# Patient Record
Sex: Male | Born: 1940 | Race: White | Hispanic: No | Marital: Married | Smoking: Never smoker
Health system: Southern US, Community
[De-identification: ages and names within clinical notes are randomized; demographics above are authoritative.]

## PROBLEM LIST (undated history)

## (undated) DIAGNOSIS — I10 Essential (primary) hypertension: Secondary | ICD-10-CM

## (undated) DIAGNOSIS — E039 Hypothyroidism, unspecified: Secondary | ICD-10-CM

## (undated) HISTORY — PX: SIGMOIDECTOMY: SHX176

---

## 2005-02-24 ENCOUNTER — Ambulatory Visit: Payer: Self-pay | Admitting: Internal Medicine

## 2005-03-09 ENCOUNTER — Emergency Department: Payer: Self-pay | Admitting: Emergency Medicine

## 2005-03-11 ENCOUNTER — Other Ambulatory Visit: Payer: Self-pay

## 2005-03-12 ENCOUNTER — Inpatient Hospital Stay: Payer: Self-pay | Admitting: General Surgery

## 2006-03-07 ENCOUNTER — Ambulatory Visit: Payer: Self-pay | Admitting: Internal Medicine

## 2006-04-03 ENCOUNTER — Ambulatory Visit: Payer: Self-pay | Admitting: Internal Medicine

## 2007-10-24 ENCOUNTER — Ambulatory Visit: Payer: Self-pay | Admitting: Unknown Physician Specialty

## 2008-01-16 ENCOUNTER — Ambulatory Visit: Payer: Self-pay | Admitting: Internal Medicine

## 2008-02-14 ENCOUNTER — Inpatient Hospital Stay (HOSPITAL_COMMUNITY): Admission: RE | Admit: 2008-02-14 | Discharge: 2008-02-15 | Payer: Self-pay | Admitting: Neurosurgery

## 2010-09-07 NOTE — Op Note (Signed)
NAME:  [name], [name]                  ACCOUNT NO.:  [id number]   MEDICAL RECORD NO.:  [id number]          PATIENT TYPE:  OIB   LOCATION:  3535                         FACILITY:  [facility]   PHYSICIAN:  Hewitt Shorts, M.D.DATE OF BIRTH:  1940/07/24   DATE OF PROCEDURE:  02/14/2008  DATE OF DISCHARGE:                               OPERATIVE REPORT   PREOPERATIVE DIAGNOSES:  1. 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  2. Cervical spondylosis.  3. Cervical degenerative disk disease.  4. Cervical radiculopathy.   POSTOPERATIVE DIAGNOSES:  1. 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  2. Cervical spondylosis.  3. Cervical degenerative disk disease.  4. Cervical radiculopathy.   PROCEDURE:  C5-6 anterior cervical decompression arthrodesis with  allograft and tethered cervical plating.   SURGEON:  Hewitt Shorts, MD.   ASSISTANT:  Nelia Shi. Webb Silversmith, NP   ANESTHESIA:  General endotracheal.   INDICATIONS:  The patient is a 70 year old man who presented with a left  cervical radiculopathy.  He was found to have a left C5-6 cervical disk  herniation with spondylosis disk herniation and degenerative disk  disease.  A decision was made to proceed with single-level anterior  cervical decompression and arthrodesis.  He had a previous C6-7 ACDF 14  years ago.   DESCRIPTION OF PROCEDURE:  The patient was brought to the operating room  and placed under general endotracheal anesthesia.  The patient was  placed in 10 pounds of Holter traction.  The neck was prepped with  Betadine soaked solution and draped in a sterile fashion.  A horizontal  incision was made in the left side of the neck.  The line of the  incision was infiltrated with local anesthetic with epinephrine.  Incision was made and carried down through the subcutaneous tissue.  Bipolar cautery and electrocauteries to maintain hemostasis.  Dissection  was carried down through the ventral aspect of the vertebral column  through the avascular plane  leaving the sternocleidomastoid, carotid  artery, and jugular vein laterally and trachea and esophagus medially.  The x-ray was taken and the C5-6 vertebral disk space was identified and  diskectomies begun with incision of the annulus continued with micro  curettes and pituitary rongeurs.  There was significant ventral spurring  that was removed using osteophytic removal and Kerrison punches.  The  microscope was draped and brought into the field to provide additional  navigation, illumination, and visualization.  The remainder of the  decompression performed using microdissection and microsurgical  technique.  The degenerative disk material was removed using micro  curettes and pituitary rongeurs.  The cauterized endplates were removed  using micro curettes and X-Max drill and then posterior osteophytic  overgrowth was removed using the X-Max drill and a 2-mm Kerrison punch  with a thin foot plate.  The posterior large ligament was carefully  removed and as we dissected laterally to the left.  A moderately large  spur like disk herniation was encountered.  Several fragments were  removed, and we were able to decompress left side of the thecal sac and  the exiting left C6 nerve root.  We  also decompressed the right C6 nerve  root as well and in the end good decompression was achieved in the  thecal sac and nerve roots bilaterally.  The wounds irrigated with  Bacitracin solution.  Checked for hemostasis.  Gelfoam with thrombin was  used to help to establish the hemostasis.  The Gelfoam was removed and  hemostasis was confirmed.  Then we measured the height of the  intervertebral disk space and selected an 8-mm allograft implant.  It  was hydrated in saline solutions and positioned in the intervertebral  disk space and countersunk.  We then discontinued the cervical traction  and selected a 14-mm tethered cervical plate, it was positioned over the  fusion construct and secured to the  vertebra with 4 x 14 mm of variable-  angle screws.  Each screw was drilled and the screws placed in the  alternating fashion.  Once all 4 screws were placed final tightening was  performed.  Then was irrigated with Bacitracin solution.  Check for  hemostasis was established and confirmed and then we proceeded with  closure.  X-ray showed the graft, plate, and screws were all in good  position.  The alignment to be good.  Platysma was closed with  interrupted inverted 2-0 undyed Vicryl sutures.  The subcutaneous and  subcuticular were closed with interrupted inverted 3-0 undyed Vicryl  sutures.  The skin was re-approximated with Dermabond.  The procedure  was tolerated well.  The estimated blood loss was 10 mL.  Sponge and  needle count were correct.  Following surgery, the patient was placed in  a soft cervical collar to be reversed from the anesthetic, extubated,  and transferred to the recovery room for further care.      Hewitt Shorts, M.D.  Electronically Signed     RWN/MEDQ  D:  02/14/2008  T:  02/15/2008  Job:  [id number]

## 2010-11-19 ENCOUNTER — Encounter: Payer: Self-pay | Admitting: Urology

## 2010-11-24 ENCOUNTER — Encounter: Payer: Self-pay | Admitting: Urology

## 2010-12-25 ENCOUNTER — Encounter: Payer: Self-pay | Admitting: Urology

## 2011-01-25 LAB — CBC
HCT: 45.9
Hemoglobin: 15.6
MCHC: 33.9
MCV: 93.9
Platelets: 195
RBC: 4.89
RDW: 13.2
WBC: 11.7 — ABNORMAL HIGH

## 2011-01-25 LAB — BASIC METABOLIC PANEL
BUN: 14
CO2: 30
Calcium: 9.7
Chloride: 102
Creatinine, Ser: 1.12
GFR calc Af Amer: >60
GFR calc non Af Amer: >60
Glucose, Bld: 107 — ABNORMAL HIGH
Potassium: 4.9
Sodium: 141

## 2011-05-10 ENCOUNTER — Ambulatory Visit: Payer: Self-pay | Admitting: Urology

## 2011-10-25 ENCOUNTER — Encounter: Payer: Self-pay | Admitting: Urology

## 2011-10-25 LAB — CBC WITH DIFFERENTIAL/PLATELET
Basophil #: 0 10*3/uL (ref 0.0–0.1)
Eosinophil #: 0.1 10*3/uL (ref 0.0–0.7)
HCT: 50.9 % (ref 40.0–52.0)
Lymphocyte #: 2.4 10*3/uL (ref 1.0–3.6)
Lymphocyte %: 31.8 %
Monocyte #: 0.3 x10 3/mm (ref 0.2–1.0)
Monocyte %: 4.4 %
RDW: 14.6 % — ABNORMAL HIGH (ref 11.5–14.5)

## 2011-11-02 LAB — HEMATOCRIT: HCT: 47.6 % (ref 40.0–52.0)

## 2011-11-02 LAB — HEMOGLOBIN: HGB: 15.8 g/dL (ref 13.0–18.0)

## 2012-03-16 ENCOUNTER — Encounter: Payer: Self-pay | Admitting: Urology

## 2012-03-16 LAB — HEMOGLOBIN: HGB: 18.2 g/dL — ABNORMAL HIGH (ref 13.0–18.0)

## 2012-03-27 ENCOUNTER — Encounter: Payer: Self-pay | Admitting: Cardiovascular Disease

## 2012-03-27 LAB — CBC WITH DIFFERENTIAL/PLATELET
Basophil %: 0.7 %
Eosinophil %: 1.8 %
HGB: 16.4 g/dL (ref 13.0–18.0)
Lymphocyte #: 2.8 10*3/uL (ref 1.0–3.6)
MCH: 32.6 pg (ref 26.0–34.0)
MCV: 94 fL (ref 80–100)
Monocyte %: 6.2 %
Platelet: 192 10*3/uL (ref 150–440)
RBC: 5.04 10*6/uL (ref 4.40–5.90)
WBC: 8.7 10*3/uL (ref 3.8–10.6)

## 2012-04-25 ENCOUNTER — Encounter: Payer: Self-pay | Admitting: Cardiovascular Disease

## 2012-08-22 ENCOUNTER — Encounter: Payer: Self-pay | Admitting: Family Medicine

## 2012-08-22 LAB — CBC WITH DIFFERENTIAL/PLATELET
Eosinophil #: 0.1 10*3/uL (ref 0.0–0.7)
Lymphocyte %: 32 %
MCH: 32.4 pg (ref 26.0–34.0)
Monocyte #: 0.5 x10 3/mm (ref 0.2–1.0)
Monocyte %: 6.5 %
Neutrophil %: 59.5 %
Platelet: 230 10*3/uL (ref 150–440)
WBC: 7.8 10*3/uL (ref 3.8–10.6)

## 2012-08-23 ENCOUNTER — Encounter: Payer: Self-pay | Admitting: Family Medicine

## 2012-09-23 ENCOUNTER — Encounter: Payer: Self-pay | Admitting: Family Medicine

## 2013-04-19 ENCOUNTER — Emergency Department: Payer: Self-pay | Admitting: Emergency Medicine

## 2013-04-19 LAB — COMPREHENSIVE METABOLIC PANEL
Alkaline Phosphatase: 52 U/L
Anion Gap: 7 (ref 7–16)
BUN: 26 mg/dL — ABNORMAL HIGH (ref 7–18)
Bilirubin,Total: 0.4 mg/dL (ref 0.2–1.0)
Chloride: 108 mmol/L — ABNORMAL HIGH (ref 98–107)
Co2: 25 mmol/L (ref 21–32)
Creatinine: 1.43 mg/dL — ABNORMAL HIGH (ref 0.60–1.30)
EGFR (African American): 56 — ABNORMAL LOW
EGFR (Non-African Amer.): 48 — ABNORMAL LOW
SGPT (ALT): 32 U/L (ref 12–78)

## 2013-04-19 LAB — CBC WITH DIFFERENTIAL/PLATELET
Basophil #: 0.1 10*3/uL (ref 0.0–0.1)
HCT: 47.9 % (ref 40.0–52.0)
Lymphocyte #: 2.9 10*3/uL (ref 1.0–3.6)
Lymphocyte %: 32.6 %
MCHC: 33.9 g/dL (ref 32.0–36.0)
MCV: 95 fL (ref 80–100)
Monocyte #: 0.7 x10 3/mm (ref 0.2–1.0)
RBC: 5.02 10*6/uL (ref 4.40–5.90)
RDW: 13.2 % (ref 11.5–14.5)

## 2013-04-19 LAB — URINALYSIS, COMPLETE
Bilirubin,UR: NEGATIVE
Blood: NEGATIVE
Glucose,UR: NEGATIVE mg/dL (ref 0–75)
Leukocyte Esterase: NEGATIVE
Ph: 5 (ref 4.5–8.0)

## 2013-04-19 LAB — CBC
Platelet: 260 10*3/uL (ref 150–440)
RBC: 5.09 10*6/uL (ref 4.40–5.90)

## 2013-05-22 ENCOUNTER — Ambulatory Visit: Payer: Self-pay | Admitting: Unknown Physician Specialty

## 2013-05-25 LAB — PATHOLOGY REPORT

## 2013-09-12 ENCOUNTER — Inpatient Hospital Stay: Payer: Self-pay | Admitting: Internal Medicine

## 2013-09-12 LAB — BASIC METABOLIC PANEL
Anion Gap: 9 (ref 7–16)
BUN: 28 mg/dL — ABNORMAL HIGH (ref 7–18)
CHLORIDE: 102 mmol/L (ref 98–107)
Calcium, Total: 9.4 mg/dL (ref 8.5–10.1)
Co2: 28 mmol/L (ref 21–32)
Creatinine: 1.6 mg/dL — ABNORMAL HIGH (ref 0.60–1.30)
GFR CALC AF AMER: 49 — AB
GFR CALC NON AF AMER: 42 — AB
Glucose: 124 mg/dL — ABNORMAL HIGH (ref 65–99)
Osmolality: 284 (ref 275–301)
Potassium: 4 mmol/L (ref 3.5–5.1)
Sodium: 139 mmol/L (ref 136–145)

## 2013-09-12 LAB — CBC
HCT: 52.7 % — ABNORMAL HIGH (ref 40.0–52.0)
HGB: 17 g/dL (ref 13.0–18.0)
MCH: 30.6 pg (ref 26.0–34.0)
MCHC: 32.3 g/dL (ref 32.0–36.0)
MCV: 95 fL (ref 80–100)
Platelet: 208 10*3/uL (ref 150–440)
RBC: 5.57 10*6/uL (ref 4.40–5.90)
RDW: 14.8 % — AB (ref 11.5–14.5)
WBC: 15.4 10*3/uL — ABNORMAL HIGH (ref 3.8–10.6)

## 2013-09-12 LAB — TROPONIN I: Troponin-I: <0.02 ng/mL

## 2013-09-12 LAB — HEPATIC FUNCTION PANEL A (ARMC)
ALBUMIN: 4.3 g/dL (ref 3.4–5.0)
ALK PHOS: 55 U/L
ALT: 23 U/L (ref 12–78)
AST: 20 U/L (ref 15–37)
BILIRUBIN DIRECT: 0.1 mg/dL (ref 0.00–0.20)
BILIRUBIN TOTAL: 0.7 mg/dL (ref 0.2–1.0)
TOTAL PROTEIN: 8.5 g/dL — AB (ref 6.4–8.2)

## 2013-09-12 LAB — LIPASE, BLOOD: LIPASE: 2357 U/L — AB (ref 73–393)

## 2013-09-13 LAB — COMPREHENSIVE METABOLIC PANEL
ALBUMIN: 3.6 g/dL (ref 3.4–5.0)
ALK PHOS: 42 U/L — AB
ALT: 16 U/L (ref 12–78)
Anion Gap: 8 (ref 7–16)
BILIRUBIN TOTAL: 0.9 mg/dL (ref 0.2–1.0)
BUN: 19 mg/dL — ABNORMAL HIGH (ref 7–18)
Calcium, Total: 8.3 mg/dL — ABNORMAL LOW (ref 8.5–10.1)
Chloride: 105 mmol/L (ref 98–107)
Co2: 24 mmol/L (ref 21–32)
Creatinine: 1.38 mg/dL — ABNORMAL HIGH (ref 0.60–1.30)
EGFR (African American): 58 — ABNORMAL LOW
EGFR (Non-African Amer.): 50 — ABNORMAL LOW
GLUCOSE: 95 mg/dL (ref 65–99)
OSMOLALITY: 276 (ref 275–301)
Potassium: 3.6 mmol/L (ref 3.5–5.1)
SGOT(AST): 24 U/L (ref 15–37)
SODIUM: 137 mmol/L (ref 136–145)
TOTAL PROTEIN: 7.4 g/dL (ref 6.4–8.2)

## 2013-09-13 LAB — TRIGLYCERIDES: Triglycerides: 170 mg/dL (ref 0–200)

## 2013-09-13 LAB — LIPASE, BLOOD: Lipase: 3679 U/L — ABNORMAL HIGH (ref 73–393)

## 2013-09-14 LAB — COMPREHENSIVE METABOLIC PANEL
ALBUMIN: 3.1 g/dL — AB (ref 3.4–5.0)
ANION GAP: 7 (ref 7–16)
Alkaline Phosphatase: 40 U/L — ABNORMAL LOW
BUN: 16 mg/dL (ref 7–18)
Bilirubin,Total: 0.9 mg/dL (ref 0.2–1.0)
CHLORIDE: 107 mmol/L (ref 98–107)
CO2: 24 mmol/L (ref 21–32)
Calcium, Total: 8 mg/dL — ABNORMAL LOW (ref 8.5–10.1)
Creatinine: 1.45 mg/dL — ABNORMAL HIGH (ref 0.60–1.30)
EGFR (African American): 55 — ABNORMAL LOW
GFR CALC NON AF AMER: 47 — AB
Glucose: 96 mg/dL (ref 65–99)
OSMOLALITY: 277 (ref 275–301)
Potassium: 3.6 mmol/L (ref 3.5–5.1)
SGOT(AST): 20 U/L (ref 15–37)
SGPT (ALT): 11 U/L — ABNORMAL LOW (ref 12–78)
Sodium: 138 mmol/L (ref 136–145)
Total Protein: 7 g/dL (ref 6.4–8.2)

## 2013-09-14 LAB — CBC WITH DIFFERENTIAL/PLATELET
BASOS PCT: 0.4 %
Basophil #: 0.1 10*3/uL (ref 0.0–0.1)
EOS ABS: 0 10*3/uL (ref 0.0–0.7)
Eosinophil %: 0.1 %
HCT: 43.9 % (ref 40.0–52.0)
HGB: 14.8 g/dL (ref 13.0–18.0)
LYMPHS ABS: 1.3 10*3/uL (ref 1.0–3.6)
LYMPHS PCT: 10.1 %
MCH: 32.1 pg (ref 26.0–34.0)
MCHC: 33.7 g/dL (ref 32.0–36.0)
MCV: 95 fL (ref 80–100)
MONO ABS: 0.9 x10 3/mm (ref 0.2–1.0)
MONOS PCT: 7.1 %
NEUTROS PCT: 82.3 %
Neutrophil #: 10.8 10*3/uL — ABNORMAL HIGH (ref 1.4–6.5)
Platelet: 147 10*3/uL — ABNORMAL LOW (ref 150–440)
RBC: 4.61 10*6/uL (ref 4.40–5.90)
RDW: 14.9 % — AB (ref 11.5–14.5)
WBC: 13.1 10*3/uL — ABNORMAL HIGH (ref 3.8–10.6)

## 2013-09-14 LAB — CLOSTRIDIUM DIFFICILE(ARMC)

## 2013-09-14 LAB — LIPASE, BLOOD: LIPASE: 923 U/L — AB (ref 73–393)

## 2013-09-15 LAB — COMPREHENSIVE METABOLIC PANEL
ALBUMIN: 2.9 g/dL — AB (ref 3.4–5.0)
ALT: 13 U/L (ref 12–78)
AST: 22 U/L (ref 15–37)
Alkaline Phosphatase: 43 U/L — ABNORMAL LOW
Anion Gap: 6 — ABNORMAL LOW (ref 7–16)
BUN: 13 mg/dL (ref 7–18)
Bilirubin,Total: 0.7 mg/dL (ref 0.2–1.0)
CHLORIDE: 109 mmol/L — AB (ref 98–107)
CREATININE: 1.26 mg/dL (ref 0.60–1.30)
Calcium, Total: 8.3 mg/dL — ABNORMAL LOW (ref 8.5–10.1)
Co2: 23 mmol/L (ref 21–32)
EGFR (African American): >60
EGFR (Non-African Amer.): 56 — ABNORMAL LOW
GLUCOSE: 81 mg/dL (ref 65–99)
Osmolality: 275 (ref 275–301)
POTASSIUM: 3.8 mmol/L (ref 3.5–5.1)
Sodium: 138 mmol/L (ref 136–145)
TOTAL PROTEIN: 7.2 g/dL (ref 6.4–8.2)

## 2013-09-15 LAB — CBC WITH DIFFERENTIAL/PLATELET
BASOS ABS: 0 10*3/uL (ref 0.0–0.1)
BASOS PCT: 0.3 %
EOS ABS: 0.1 10*3/uL (ref 0.0–0.7)
Eosinophil %: 0.7 %
HCT: 43.8 % (ref 40.0–52.0)
HGB: 14.5 g/dL (ref 13.0–18.0)
LYMPHS ABS: 1.4 10*3/uL (ref 1.0–3.6)
LYMPHS PCT: 13.4 %
MCH: 31.5 pg (ref 26.0–34.0)
MCHC: 33 g/dL (ref 32.0–36.0)
MCV: 96 fL (ref 80–100)
MONO ABS: 0.6 x10 3/mm (ref 0.2–1.0)
MONOS PCT: 5.6 %
NEUTROS ABS: 8.7 10*3/uL — AB (ref 1.4–6.5)
Neutrophil %: 80 %
PLATELETS: 156 10*3/uL (ref 150–440)
RBC: 4.59 10*6/uL (ref 4.40–5.90)
RDW: 14.7 % — AB (ref 11.5–14.5)
WBC: 10.8 10*3/uL — AB (ref 3.8–10.6)

## 2013-09-15 LAB — LIPASE, BLOOD: LIPASE: 416 U/L — AB (ref 73–393)

## 2014-08-13 ENCOUNTER — Ambulatory Visit
Admit: 2014-08-13 | Disposition: A | Discharge status: Home / Self Care | Payer: Self-pay | Attending: Urology | Admitting: Urology

## 2014-08-16 NOTE — H&P (Signed)
PATIENT NAMLeonia Reeves:  Glantz, Koa MR#:  [id number] DATE OF BIRTH:  11-09-1940  DATE OF ADMISSION:  09/12/2013  PRIMARY CARE PHYSICIAN: John B. Danne HarborWalker III, MD  CHIEF COMPLAINT: Chest pain and back pain.   HISTORY OF PRESENT ILLNESS: Mr. Jacqulyn BathLong is a very pleasant 74 year old Caucasian gentleman with history of hypertension, history of prostatitis and diverticulitis, who comes to the Emergency Room after he started having some chest discomfort in early hours of this morning which kind of radiated to the back and had some nausea. Denies any shortness of breath. Came to the Emergency Room. On initial workup, was found to have elevated lipase. His EKG did not show any acute ST elevation or depression. His cardiac enzymes remained essentially negative. Given his elevated lipase, he underwent CAT scan of the abdomen, which showed pancreatitis in the tail of the pancreas. The patient denies any history of alcohol abuse. He has been taking lisinopril/hydrochlorothiazide for a prolonged period of time. He is being admitted for acute pancreatitis.   PAST MEDICAL HISTORY:  1. Some sort of liver disease. He was jaundiced several years ago. [facility], details not available.  2. Hypertension.  3. Prostatitis.  4. Kidney stones.  5. Diverticulitis.   MEDICATIONS:  1. Hydrochlorothiazide/lisinopril 25/20 one p.o. daily.  2. Gemfibrozil 600 mg b.i.d.  3. AndroGel 1 packet transdermal daily as needed.  4. Metoprolol 25 mg b.i.d.   ALLERGIES: SULFA.   SOCIAL HISTORY: Former smoker. Denies drinking alcohol.   FAMILY HISTORY: Positive for hypertension and stroke.   REVIEW OF SYSTEMS:  CONSTITUTIONAL: No fever, fatigue, weakness.  EYES: No blurred or double vision, cataracts or glaucoma.  ENT: No tinnitus, ear pain, hearing loss.  RESPIRATORY: No cough, wheeze, hemoptysis or COPD.  CARDIOVASCULAR: No chest pain, orthopnea or edema. Positive for hypertension.  GASTROINTESTINAL: Positive for some nausea and  abdominal pain. Positive for GERD. GENITOURINARY: No dysuria, hematuria or frequency.  ENDOCRINE: No polyuria, nocturia or thyroid problems.  HEMATOLOGY: No anemia or easy bruising.  SKIN: No acne or rash.  MUSCULOSKELETAL: Positive for arthritis.  NEUROLOGIC: No CVA, TIA, anxiety attacks or migraines.  PSYCHIATRIC: No anxiety, depression or bipolar disorder.  All other systems reviewed and negative.   PHYSICAL EXAMINATION:  GENERAL: The patient is awake, alert, oriented x3, not in acute distress.  VITAL SIGNS: Afebrile. Pulse is 64, blood pressure is 151/75, pulse oximetry is 94% on room air.  HEENT: Atraumatic, normocephalic. Pupils are equal, round and reactive to light and accommodation. EOM intact. Oral mucosa is moist.  NECK: Supple. No JVD. No carotid bruits.  RESPIRATORY: Clear to auscultation bilaterally. No rales, rhonchi, respiratory distress or labored breathing.  CARDIOVASCULAR: Both the heart sounds are normal. Rate and rhythm regular. PMI not lateralized. Chest nontender. Good pedal pulses, good femoral pulses. No lower extremity edema.  ABDOMEN: Soft, benign. No organomegaly. No tenderness, guarding or rigidity.  NEUROLOGIC: Grossly intact cranial nerves II through XII. No motor or sensory deficit.  PSYCHIATRIC: The patient is awake, alert, oriented x3.   LABORATORY DATA: White count is 15.4, H and H is 17 and 52.7. Glucose is 124. BUN 28, creatinine 1.6, sodium is 139, potassium is 4, chloride is 102, bicarbonate is 28, calcium is 9.4. Lipase is 2357. LFTs within normal limits. Total protein is 8.5.   IMAGING: CT of the abdomen shows pancreatitis involving the tail of the pancreas, minimal diverticulosis of the colon, emphysema.   ASSESSMENT AND PLAN: The 74 year old Mr. Jacqulyn BathLong, with history of hypertension,  prostatitis and history of diverticulitis, comes in with chest pain, which radiated to the back. He is being admitted with:   1. Acute pancreatitis. Etiology unclear.  The patient has a pending ultrasound of the abdomen. Will admit the patient to medical floor. Keep him n.p.o. except ice chips. Continue IV fluids. GI consultation. I will discontinue hydrochlorothiazide part of his antihypertensive meds. Follow up lipase and LFTs clinically. Will follow up ultrasound of the abdomen results as well.  2. Chronic kidney disease stage III, baseline creatinine is around 1.43. Will continue IV fluids for hydration. Avoid nephrotoxins.  3. Hypertension. I will continue metoprolol 25 b.i.d. The patient is also on lisinopril 25 mg, which I will hold off right now until kidney numbers improve.  4. Hyperlipidemia. Will continue gemfibrozil.  5. Deep vein thrombosis prophylaxis. Subcutaneous heparin t.i.d.   Above was discussed with the patient and the patient's wife, who was present in the Emergency Room.  CODE STATUS: The patient is a full code.   TIME SPENT: 45 minutes.   ____________________________ Wylie Hail Allena Katz, MD sap:lb D: 09/12/2013 13:03:32 ET T: 09/12/2013 13:25:27 ET JOB#: [id number]  cc: Gavina Dildine A. Allena Katz, MD, <Dictator> John B. Danne Harbor, MD Willow Ora MD ELECTRONICALLY SIGNED 09/15/2013 16:26

## 2014-08-16 NOTE — Discharge Summary (Signed)
PATIENT NAMLeonia Pennington:  Herro, [name] MR#:  [id number] DATE OF BIRTH:  1940/10/03  DATE OF ADMISSION:  09/12/2013 DATE OF DISCHARGE:  09/16/2013   FINAL DIAGNOSES: 1.  Acute pancreatitis.  2.  Hypertension.  3.  Hyperlipidemia.  4.  History of prostatitis.  5.  History of kidney stones.  6.  History of diverticulitis.   HISTORY AND PHYSICAL: Please see dictated admission history and physical.   HOSPITAL COURSE: The patient was admitted with abdominal pain radiating to his back. He underwent CT scanning, which revealed evidence acute pancreatitis in the tail of the pancreas. His lipase was noted to be 2357. There were no other significant abnormalities reported on his colonoscopy, other than some COPD changes and some minimal diverticulosis. He underwent abdominal ultrasound, which revealed no gallstones or gallbladder wall thickening, although there was note of possible gallbladder wall polyps, which certainly could be small stones. His transaminases were unremarkable. He was evaluated by GI. His diet was slowly restarted and he tolerated this, moving to solid foods, eating eggs and creamed potatoes on the day of discharge, without major issues. At this time, he will be discharged home in stable condition. His physical activity will be up as tolerated. He should follow a bland diet, followed by low-sodium. He will follow up with Dr. Dan HumphreysWalker within the next 1 to 2 weeks and with  GI in the next couple of weeks as well.   DISCHARGE MEDICATIONS: 1.  Lopressor 25 mg p.o. b.i.d.  2.  AndroGel 1 packet topically once a day.  3.  Ondansetron 4 mg p.o. t.i.d. p.r.n. nausea or vomiting.  4.  Norco 5/325, 1 p.o. 4 times a day as needed for pain.  5.  Pantoprazole 40 mg p.o. daily.   Hydrochlorothiazide and gemfibrozil were held at the time of discharge, and the patient will  follow his blood pressure and consider restarting these medications once he follows up with his primary care physician.    ____________________________ Lynnea FerrierBert J. Albert Devaul III, MD bjk:jcm D: 09/16/2013 15:43:48 ET T: 09/16/2013 15:57:29 ET JOB#: [id number]  cc: Lynnea FerrierBert J. Ellissa Ayo III, MD, <Dictator> Scot Junobert T. Elliott, MD Letta PateJohn B. Danne HarborWalker III, MD Daniel NonesBERT Estefana Taylor MD ELECTRONICALLY SIGNED 09/18/2013 13:28

## 2014-08-16 NOTE — Consult Note (Signed)
Chief Complaint:  Subjective/Chief Complaint LUQ/left back pain 5/10.  c/o nausea, vomited once this morning after morphine.  Lipase 3679.   VITAL SIGNS/ANCILLARY NOTES: **Vital Signs.:   22-May-15 05:03  Vital Signs Type Routine  Temperature Temperature (F) 98.1  Celsius 36.7  Temperature Source oral  Pulse Pulse 87  Respirations Respirations 20  Systolic BP Systolic BP 203  Diastolic BP (mmHg) Diastolic BP (mmHg) 85  Mean BP 109  Pulse Ox % Pulse Ox % 90  Pulse Ox Activity Level  At rest  Oxygen Delivery Room Air/ 21 %    09:50  Vital Signs Type Pre Medication  Pulse Pulse 91  Respirations Respirations 18  Systolic BP Systolic BP 559  Diastolic BP (mmHg) Diastolic BP (mmHg) 68  Mean BP 102  Pulse Ox % Pulse Ox % 90  Pulse Ox Activity Level  At rest  Oxygen Delivery Room Air/ 21 %   Brief Assessment:  GEN well developed, well nourished, no acute distress, obese, A/Ox3, wife at [place]   Cardiac Regular   Respiratory normal resp effort   Gastrointestinal details normal Soft  +mild distention, faint BSx4, +TTP LUQ/umbilicus   EXTR negative cyanosis/clubbing, negative edema   Additional Physical Exam Skin: pink, warm ,dry HEENT: sclera clear, non-icteric   Lab Results:  Hepatic:  22-May-15 06:26   Bilirubin, Total 0.9  Alkaline Phosphatase  42 (45-117 NOTE: New Reference Range 03/15/13)  SGPT (ALT) 16  SGOT (AST) 24  Total Protein, Serum 7.4  Albumin, Serum 3.6  Routine Chem:  22-May-15 06:26   Triglycerides, Serum 170 (Result(s) reported on 13 Sep 2013 at 07:27AM.)  Lipase  3679 (Result(s) reported on 13 Sep 2013 at 07:28AM.)  Glucose, Serum 95  BUN  19  Creatinine (comp)  1.38  Sodium, Serum 137  Potassium, Serum 3.6  Chloride, Serum 105  CO2, Serum 24  Calcium (Total), Serum  8.3  Osmolality (calc) 276  eGFR (African American)  58  eGFR (Non-African American)  50 (eGFR values <35m/min/1.73 m2 may be an indication of chronic kidney disease  (CKD). Calculated eGFR is useful in patients with stable renal function. The eGFR calculation will not be reliable in acutely ill patients when serum creatinine is changing rapidly. It is not useful in  patients on dialysis. The eGFR calculation may not be applicable to patients at the low and high extremes of body sizes, pregnant women, and vegetarians.)  Anion Gap 8   Assessment/Plan:  Assessment/Plan:  Assessment 1st episode acute pancreatitis:  Likely drug-induced vs. idiopathic.  Pain improving, still with some nausea & vomiting on ice chips/meds.  If no improvement today, may need strict NPO.   Plan 1) Remain NPO except ice./med, but may need to change to strict NPO if continued pain, N/V 2) continue Aggressive IV fluids, pain control, antiemetics 3) Continue Protonix 480mBID 4) Follow up IgG4 & triglycerides 5) CBC, lipase in AM Dr ReRayann Hemano cover over the weekend. Please call if you have any questions or concerns   Electronic Signatures: JoAndria MeuseNP)  (Signed 22-May-15 11:05)  Authored: Chief Complaint, VITAL SIGNS/ANCILLARY NOTES, Brief Assessment, Lab Results, Assessment/Plan   Last Updated: 22-May-15 11:05 by JoAndria MeuseNP)

## 2014-08-16 NOTE — Consult Note (Signed)
Brief Consult Note: Diagnosis: acute pancreatitis.   Patient was seen by consultant.   Comments: Mr. [name] is a very pleasant 74 y/o caucasian male with 1st episode acute pancreatitis.  There is no known hx of significant hypertriglyceridemia, alcohol intake, or cholelithiasis.  He is on HCTZ/lisinopril which can cause pancreatitis & admits to skipping some doses.  Will check for hypertriglyceridemia & autoimmune pancreatitis.   Plan: 1) Agree with NPO as he is still having severe pain, may try clear liquids in AM 2) Aggressive IV fluids, pain control, antiemetics 3) Protonix 40mg  daily IV 4) He requests heating pad 20min q2h 5) IgG4 & triglycerides Thanks for allowing us to participate in his care.  Please see full dictated note. #[id number].  Electronic Signatures: Joselyn ArrowJones, Krishawna Stiefel L (NP)  (Signed 21-May-15 22:28)  Authored: Brief Consult Note   Last Updated: 21-May-15 22:28 by Joselyn ArrowJones, Josaiah Muhammed L (NP)

## 2014-08-16 NOTE — Consult Note (Signed)
Chief Complaint:  Subjective/Chief Complaint Pt denies any abdominal pain, nausea or vomiting.  He is having daily BMs still loose but only 1 today.  Ate eggs for breakfast & regular lunch wihtout pain.   VITAL SIGNS/ANCILLARY NOTES: **Vital Signs.:   25-May-15 09:00  Vital Signs Type Routine  Temperature Temperature (F) 98  Celsius 36.6  Temperature Source oral  Pulse Pulse 79  Respirations Respirations 18  Systolic BP Systolic BP 159  Diastolic BP (mmHg) Diastolic BP (mmHg) 85  Mean BP 109  Pulse Ox % Pulse Ox % 94  Pulse Ox Activity Level  At rest  Oxygen Delivery Room Air/ 21 %   Brief Assessment:  GEN well developed, well nourished, no acute distress, obese, A/Ox3, wife at [employer]   Cardiac Regular   Respiratory normal resp effort   Gastrointestinal details normal Soft  Nontender  Bowel sounds normal  No rebound tenderness  No gaurding   EXTR negative cyanosis/clubbing, negative edema   Additional Physical Exam Skin: pink, warm ,dry HEENT: sclera clear, non-icteric   Lab Results: Routine Hem:  24-May-15 06:07   Eosinophil # 0.1   Assessment/Plan:  Assessment/Plan:  Assessment 1st episode acute pancreatitis:  Patient muchimproved tolerating regular diet.  Likely drug-induced vs. idiopathic.   Plan 1) Follow up with Dr Mechele CollinElliott 2) Follow up with PCP about BP meds Please call if you have any questions or concerns   Electronic Signatures: Joselyn ArrowJones, Shiva Sahagian L (NP)  (Signed 25-May-15 15:56)  Authored: Chief Complaint, VITAL SIGNS/ANCILLARY NOTES, Brief Assessment, Lab Results, Assessment/Plan   Last Updated: 25-May-15 15:56 by Joselyn ArrowJones, Erle Guster L (NP)

## 2014-08-16 NOTE — Consult Note (Signed)
PATIENT NAMERAEQUON, [name] MR#:  [id number] DATE OF BIRTH:  Aug 13, 1940  DATE OF CONSULTATION:  09/12/2013  REFERRING PHYSICIAN:  Dr. Posey Pronto CONSULTING PHYSICIAN:  Andria Meuse, NP CONSULTING GASTROENTEROLOGIST:  Dr. Lucilla Lame  PRIMARY CARE PHYSICIAN:  Dr. Lisette Grinder, III  REASON FOR CONSULTATION:  Acute pancreatitis.   HISTORY OF PRESENT ILLNESS:  Mr. [name] is a 74 year old Caucasian male who was in his usual state of health until yesterday at around 10:00 p.m.  Shortly after he went to bed he began to experience some discomfort.  Around 1:00 a.m. he awakened with severe back pain and chest pain.  The pain was 10 out of 10 on pain scale.  Pain continued to radiate to the epigastrium and back.  He did take some Advil and indigestion pill over-the-counter and did not experience much relief.  He tried a heating pad which helped some, but he presented to the hospital with severe pain and nausea.  He denies any vomiting.  He does have a history of occasional heartburn and takes as needed over-the-counter acid reducer a couple of times a year.  His weight has been stable.  He denies any alcohol use.  Denies any illicit drug use.  His lipase is 2357.  His LFTs were normal.  His white count was 15.4.  He denies any new medications.  He did increase his dose of hydrochlorothiazide and lisinopril recently he believes.  He does admit to taking it sporadically as well.  He had a CT with IV contrast which showed uncomplicated pancreatitis of the pancreatic tail, diverticulosis, and COPD.  His ultrasound showed a 4.3 mm common bile duct, ventral gallbladder wall shadows, likely polyps, the largest 3.3 mm.  He had a three-way abdomen which was negative.   PAST MEDICAL HISTORY AND PAST SURGICAL HISTORY:  GERD, history of jaundice, but the patient did not remember details.  No known chronic liver disease.  He has a history of prostatitis, hypertension.  He had a colonoscopy by Dr. Vira Agar in January 2015 which  showed internal hemorrhoids and diverticulosis.  He had a small benign polyp removed which was hyperplastic.  He tells me he had a colectomy for diverticulitis about 7 to 10 years ago.    MEDICATIONS PRIOR TO ADMISSION:  AndroGel 25 mg/2.5 grams 1 packet in the morning, gemfibrozil 600 mg twice daily, hydrochlorothiazide/lisinopril 25/20 mg daily, metoprolol 25 mg twice daily.   ALLERGIES:  SULFA CAUSES SWELLING.   FAMILY HISTORY:  There is no known family history of pancreatic disease, liver disease or colorectal or GI problems.   SOCIAL HISTORY:  He is married.  He has three grown children.  He is retired from [employer] business.  He denies any tobacco, alcohol or illicit drug use.   REVIEW OF SYSTEMS:  See HPI, otherwise negative 12 point review of systems.   PHYSICAL EXAMINATION: VITAL SIGNS:  Temp 97.7, pulse 77, respirations 20, blood pressure 159/84, O2 sat 93% on room air.  GENERAL:  He is a well-developed, well-nourished Caucasian male in no acute distress.  HEENT:  Sclerae clear, anicteric.  Conjunctivae pink.  Oropharynx pink and moist without any lesions.  NECK:  Supple without any mass or thyromegaly.  CHEST:  Heart regular rate and rhythm.  Normal S1, S2 without murmurs, clicks, rubs, or gallops.  LUNGS:  Clear to auscultation bilaterally.  ABDOMEN:  Protuberant.  Positive bowel sounds x 4.  No bruits auscultated.  Abdomen is soft, nondistended.  He has mild  to moderate tenderness to his right upper quadrant.  He has significant tenderness to his left upper quadrant and mid abdomen.  He does have rebound tenderness.  Unable to palpate hepatosplenomegaly or mass.  Exam is limited given the patient's body habitus.  EXTREMITIES:  Without clubbing or edema.  SKIN:  Pink, warm and dry without any rash or jaundice.  NEUROLOGIC:  Grossly intact.  MUSCULOSKELETAL:  Good equal movement and strength bilaterally.  PSYCHIATRIC:  Alert, cooperative, normal mood and affect.  He is  accompanied by his son.   LABORATORY STUDIES:  Glucose 124, BUN 28, creatinine 1.6, otherwise normal met-7.  Normal LFTs except for total protein of 8.5.  Troponin was negative.  Hemoglobin 17, hematocrit 52.7, platelets 208.   IMPRESSION:  [name] is a very pleasant 74 year old Caucasian male with first episode of acute pancreatitis.  There is no known history of significant hypertriglyceridemia, alcohol intake or cholelithiasis.  He is on hydrochlorothiazide/lisinopril, which can cause pancreatitis and admits to skipping some doses recently and a recent dose change.  We will check for hypertriglyceridemia and autoimmune pancreatitis.   PLAN: 1.  Agree with nothing by mouth as he is still having severe pain.  May try clear liquids in the morning.  2.  Aggressive intravenous fluids, pain control, antiemetics.  3.  Protonix 40 mg daily intravenous.  4.  He requests heating pad which is okay with me for 20 minutes every two hours.  5.  IgG4 and triglycerides in the morning.  6.  If he were to have another episode of idiopathic pancreatitis, would send him for endoscopic ultrasound for further evaluation to rule out small mass or stricture or tumor or microlithiasis.   Thank you for allowing [place] to participate in his care.   ____________________________ Andria Meuse, NP klj:ea D: 09/12/2013 22:26:24 ET T: 09/12/2013 23:02:10 ET JOB#: [id number]  cc: Andria Meuse, NP, <Dictator> John B. Sarina Ser, MD [place] ELECTRONICALLY SIGNED 09/17/2013 15:25

## 2015-06-18 DIAGNOSIS — E034 Atrophy of thyroid (acquired): Secondary | ICD-10-CM | POA: Diagnosis not present

## 2015-06-30 DIAGNOSIS — N183 Chronic kidney disease, stage 3 (moderate): Secondary | ICD-10-CM | POA: Diagnosis not present

## 2015-06-30 DIAGNOSIS — I10 Essential (primary) hypertension: Secondary | ICD-10-CM | POA: Diagnosis not present

## 2015-06-30 DIAGNOSIS — E034 Atrophy of thyroid (acquired): Secondary | ICD-10-CM | POA: Diagnosis not present

## 2015-06-30 DIAGNOSIS — G4733 Obstructive sleep apnea (adult) (pediatric): Secondary | ICD-10-CM | POA: Diagnosis not present

## 2015-07-15 DIAGNOSIS — Z8719 Personal history of other diseases of the digestive system: Secondary | ICD-10-CM | POA: Diagnosis not present

## 2015-09-14 DIAGNOSIS — E1122 Type 2 diabetes mellitus with diabetic chronic kidney disease: Secondary | ICD-10-CM | POA: Diagnosis not present

## 2015-09-14 DIAGNOSIS — I129 Hypertensive chronic kidney disease with stage 1 through stage 4 chronic kidney disease, or unspecified chronic kidney disease: Secondary | ICD-10-CM | POA: Diagnosis not present

## 2015-09-14 DIAGNOSIS — R809 Proteinuria, unspecified: Secondary | ICD-10-CM | POA: Diagnosis not present

## 2015-09-14 DIAGNOSIS — E559 Vitamin D deficiency, unspecified: Secondary | ICD-10-CM | POA: Diagnosis not present

## 2015-09-14 DIAGNOSIS — N183 Chronic kidney disease, stage 3 (moderate): Secondary | ICD-10-CM | POA: Diagnosis not present

## 2015-09-25 DIAGNOSIS — I10 Essential (primary) hypertension: Secondary | ICD-10-CM | POA: Diagnosis not present

## 2015-09-25 DIAGNOSIS — E034 Atrophy of thyroid (acquired): Secondary | ICD-10-CM | POA: Diagnosis not present

## 2015-09-29 DIAGNOSIS — R351 Nocturia: Secondary | ICD-10-CM | POA: Diagnosis not present

## 2015-09-29 DIAGNOSIS — E291 Testicular hypofunction: Secondary | ICD-10-CM | POA: Diagnosis not present

## 2015-09-29 DIAGNOSIS — N5201 Erectile dysfunction due to arterial insufficiency: Secondary | ICD-10-CM | POA: Diagnosis not present

## 2015-09-29 DIAGNOSIS — N401 Enlarged prostate with lower urinary tract symptoms: Secondary | ICD-10-CM | POA: Diagnosis not present

## 2015-09-29 DIAGNOSIS — Z79899 Other long term (current) drug therapy: Secondary | ICD-10-CM | POA: Diagnosis not present

## 2015-10-01 DIAGNOSIS — I10 Essential (primary) hypertension: Secondary | ICD-10-CM | POA: Diagnosis not present

## 2015-10-01 DIAGNOSIS — E782 Mixed hyperlipidemia: Secondary | ICD-10-CM | POA: Diagnosis not present

## 2015-10-01 DIAGNOSIS — E034 Atrophy of thyroid (acquired): Secondary | ICD-10-CM | POA: Diagnosis not present

## 2015-10-01 DIAGNOSIS — Z0001 Encounter for general adult medical examination with abnormal findings: Secondary | ICD-10-CM | POA: Diagnosis not present

## 2015-10-01 DIAGNOSIS — G4733 Obstructive sleep apnea (adult) (pediatric): Secondary | ICD-10-CM | POA: Diagnosis not present

## 2016-03-14 DIAGNOSIS — R809 Proteinuria, unspecified: Secondary | ICD-10-CM | POA: Diagnosis not present

## 2016-03-14 DIAGNOSIS — N2581 Secondary hyperparathyroidism of renal origin: Secondary | ICD-10-CM | POA: Diagnosis not present

## 2016-03-14 DIAGNOSIS — I129 Hypertensive chronic kidney disease with stage 1 through stage 4 chronic kidney disease, or unspecified chronic kidney disease: Secondary | ICD-10-CM | POA: Diagnosis not present

## 2016-03-14 DIAGNOSIS — N183 Chronic kidney disease, stage 3 (moderate): Secondary | ICD-10-CM | POA: Diagnosis not present

## 2016-03-14 DIAGNOSIS — E559 Vitamin D deficiency, unspecified: Secondary | ICD-10-CM | POA: Diagnosis not present

## 2016-03-24 DIAGNOSIS — I10 Essential (primary) hypertension: Secondary | ICD-10-CM | POA: Diagnosis not present

## 2016-03-31 DIAGNOSIS — E782 Mixed hyperlipidemia: Secondary | ICD-10-CM | POA: Diagnosis not present

## 2016-03-31 DIAGNOSIS — Z0000 Encounter for general adult medical examination without abnormal findings: Secondary | ICD-10-CM | POA: Diagnosis not present

## 2016-03-31 DIAGNOSIS — I10 Essential (primary) hypertension: Secondary | ICD-10-CM | POA: Diagnosis not present

## 2016-03-31 DIAGNOSIS — Z125 Encounter for screening for malignant neoplasm of prostate: Secondary | ICD-10-CM | POA: Diagnosis not present

## 2016-03-31 DIAGNOSIS — N183 Chronic kidney disease, stage 3 (moderate): Secondary | ICD-10-CM | POA: Diagnosis not present

## 2016-03-31 DIAGNOSIS — Z87898 Personal history of other specified conditions: Secondary | ICD-10-CM | POA: Diagnosis not present

## 2016-03-31 DIAGNOSIS — G4733 Obstructive sleep apnea (adult) (pediatric): Secondary | ICD-10-CM | POA: Diagnosis not present

## 2016-08-03 DIAGNOSIS — Z760 Encounter for issue of repeat prescription: Secondary | ICD-10-CM | POA: Diagnosis not present

## 2016-08-31 DIAGNOSIS — N183 Chronic kidney disease, stage 3 (moderate): Secondary | ICD-10-CM | POA: Diagnosis not present

## 2016-08-31 DIAGNOSIS — N2581 Secondary hyperparathyroidism of renal origin: Secondary | ICD-10-CM | POA: Diagnosis not present

## 2016-08-31 DIAGNOSIS — E559 Vitamin D deficiency, unspecified: Secondary | ICD-10-CM | POA: Diagnosis not present

## 2016-08-31 DIAGNOSIS — E1122 Type 2 diabetes mellitus with diabetic chronic kidney disease: Secondary | ICD-10-CM | POA: Diagnosis not present

## 2016-08-31 DIAGNOSIS — I129 Hypertensive chronic kidney disease with stage 1 through stage 4 chronic kidney disease, or unspecified chronic kidney disease: Secondary | ICD-10-CM | POA: Diagnosis not present

## 2016-08-31 DIAGNOSIS — R809 Proteinuria, unspecified: Secondary | ICD-10-CM | POA: Diagnosis not present

## 2016-09-22 DIAGNOSIS — E782 Mixed hyperlipidemia: Secondary | ICD-10-CM | POA: Diagnosis not present

## 2016-09-22 DIAGNOSIS — I10 Essential (primary) hypertension: Secondary | ICD-10-CM | POA: Diagnosis not present

## 2016-09-22 DIAGNOSIS — Z125 Encounter for screening for malignant neoplasm of prostate: Secondary | ICD-10-CM | POA: Diagnosis not present

## 2016-09-29 DIAGNOSIS — I10 Essential (primary) hypertension: Secondary | ICD-10-CM | POA: Diagnosis not present

## 2016-09-29 DIAGNOSIS — G4733 Obstructive sleep apnea (adult) (pediatric): Secondary | ICD-10-CM | POA: Diagnosis not present

## 2016-09-29 DIAGNOSIS — Z0001 Encounter for general adult medical examination with abnormal findings: Secondary | ICD-10-CM | POA: Diagnosis not present

## 2016-09-29 DIAGNOSIS — E034 Atrophy of thyroid (acquired): Secondary | ICD-10-CM | POA: Diagnosis not present

## 2016-09-29 DIAGNOSIS — E782 Mixed hyperlipidemia: Secondary | ICD-10-CM | POA: Diagnosis not present

## 2016-09-29 DIAGNOSIS — N183 Chronic kidney disease, stage 3 (moderate): Secondary | ICD-10-CM | POA: Diagnosis not present

## 2017-02-02 DIAGNOSIS — Z23 Encounter for immunization: Secondary | ICD-10-CM | POA: Diagnosis not present

## 2017-03-09 DIAGNOSIS — I129 Hypertensive chronic kidney disease with stage 1 through stage 4 chronic kidney disease, or unspecified chronic kidney disease: Secondary | ICD-10-CM | POA: Diagnosis not present

## 2017-03-09 DIAGNOSIS — N2581 Secondary hyperparathyroidism of renal origin: Secondary | ICD-10-CM | POA: Diagnosis not present

## 2017-03-09 DIAGNOSIS — N183 Chronic kidney disease, stage 3 (moderate): Secondary | ICD-10-CM | POA: Diagnosis not present

## 2017-03-09 DIAGNOSIS — R809 Proteinuria, unspecified: Secondary | ICD-10-CM | POA: Diagnosis not present

## 2017-03-09 DIAGNOSIS — E1122 Type 2 diabetes mellitus with diabetic chronic kidney disease: Secondary | ICD-10-CM | POA: Diagnosis not present

## 2017-03-09 DIAGNOSIS — E559 Vitamin D deficiency, unspecified: Secondary | ICD-10-CM | POA: Diagnosis not present

## 2017-03-28 DIAGNOSIS — E034 Atrophy of thyroid (acquired): Secondary | ICD-10-CM | POA: Diagnosis not present

## 2017-03-28 DIAGNOSIS — I10 Essential (primary) hypertension: Secondary | ICD-10-CM | POA: Diagnosis not present

## 2017-04-27 DIAGNOSIS — Z5181 Encounter for therapeutic drug level monitoring: Secondary | ICD-10-CM | POA: Diagnosis not present

## 2017-04-27 DIAGNOSIS — E782 Mixed hyperlipidemia: Secondary | ICD-10-CM | POA: Diagnosis not present

## 2017-04-27 DIAGNOSIS — I10 Essential (primary) hypertension: Secondary | ICD-10-CM | POA: Diagnosis not present

## 2017-04-27 DIAGNOSIS — E034 Atrophy of thyroid (acquired): Secondary | ICD-10-CM | POA: Diagnosis not present

## 2017-08-09 DIAGNOSIS — E039 Hypothyroidism, unspecified: Secondary | ICD-10-CM | POA: Diagnosis not present

## 2017-08-09 DIAGNOSIS — I10 Essential (primary) hypertension: Secondary | ICD-10-CM | POA: Diagnosis not present

## 2017-08-31 DIAGNOSIS — N183 Chronic kidney disease, stage 3 (moderate): Secondary | ICD-10-CM | POA: Diagnosis not present

## 2017-08-31 DIAGNOSIS — N2581 Secondary hyperparathyroidism of renal origin: Secondary | ICD-10-CM | POA: Diagnosis not present

## 2017-08-31 DIAGNOSIS — R809 Proteinuria, unspecified: Secondary | ICD-10-CM | POA: Diagnosis not present

## 2017-08-31 DIAGNOSIS — E1122 Type 2 diabetes mellitus with diabetic chronic kidney disease: Secondary | ICD-10-CM | POA: Diagnosis not present

## 2017-08-31 DIAGNOSIS — I129 Hypertensive chronic kidney disease with stage 1 through stage 4 chronic kidney disease, or unspecified chronic kidney disease: Secondary | ICD-10-CM | POA: Diagnosis not present

## 2017-10-18 DIAGNOSIS — Z5181 Encounter for therapeutic drug level monitoring: Secondary | ICD-10-CM | POA: Diagnosis not present

## 2017-10-18 DIAGNOSIS — E034 Atrophy of thyroid (acquired): Secondary | ICD-10-CM | POA: Diagnosis not present

## 2017-10-18 DIAGNOSIS — E782 Mixed hyperlipidemia: Secondary | ICD-10-CM | POA: Diagnosis not present

## 2017-10-25 DIAGNOSIS — E782 Mixed hyperlipidemia: Secondary | ICD-10-CM | POA: Diagnosis not present

## 2017-10-25 DIAGNOSIS — I10 Essential (primary) hypertension: Secondary | ICD-10-CM | POA: Diagnosis not present

## 2017-10-25 DIAGNOSIS — N183 Chronic kidney disease, stage 3 (moderate): Secondary | ICD-10-CM | POA: Diagnosis not present

## 2017-10-25 DIAGNOSIS — Z0001 Encounter for general adult medical examination with abnormal findings: Secondary | ICD-10-CM | POA: Diagnosis not present

## 2017-10-25 DIAGNOSIS — E034 Atrophy of thyroid (acquired): Secondary | ICD-10-CM | POA: Diagnosis not present

## 2017-10-25 DIAGNOSIS — Z0000 Encounter for general adult medical examination without abnormal findings: Secondary | ICD-10-CM | POA: Diagnosis not present

## 2017-10-25 DIAGNOSIS — G4733 Obstructive sleep apnea (adult) (pediatric): Secondary | ICD-10-CM | POA: Diagnosis not present

## 2017-11-09 DIAGNOSIS — H903 Sensorineural hearing loss, bilateral: Secondary | ICD-10-CM | POA: Diagnosis not present

## 2017-11-09 DIAGNOSIS — H6123 Impacted cerumen, bilateral: Secondary | ICD-10-CM | POA: Diagnosis not present

## 2018-01-26 DIAGNOSIS — Z23 Encounter for immunization: Secondary | ICD-10-CM | POA: Diagnosis not present

## 2018-03-01 DIAGNOSIS — I10 Essential (primary) hypertension: Secondary | ICD-10-CM | POA: Diagnosis not present

## 2018-03-01 DIAGNOSIS — R319 Hematuria, unspecified: Secondary | ICD-10-CM | POA: Diagnosis not present

## 2018-03-01 DIAGNOSIS — R809 Proteinuria, unspecified: Secondary | ICD-10-CM | POA: Diagnosis not present

## 2018-03-01 DIAGNOSIS — N183 Chronic kidney disease, stage 3 (moderate): Secondary | ICD-10-CM | POA: Diagnosis not present

## 2018-03-07 DIAGNOSIS — N183 Chronic kidney disease, stage 3 (moderate): Secondary | ICD-10-CM | POA: Diagnosis not present

## 2018-03-07 DIAGNOSIS — N2581 Secondary hyperparathyroidism of renal origin: Secondary | ICD-10-CM | POA: Diagnosis not present

## 2018-03-07 DIAGNOSIS — R809 Proteinuria, unspecified: Secondary | ICD-10-CM | POA: Diagnosis not present

## 2018-03-07 DIAGNOSIS — I129 Hypertensive chronic kidney disease with stage 1 through stage 4 chronic kidney disease, or unspecified chronic kidney disease: Secondary | ICD-10-CM | POA: Diagnosis not present

## 2018-03-07 DIAGNOSIS — E1122 Type 2 diabetes mellitus with diabetic chronic kidney disease: Secondary | ICD-10-CM | POA: Diagnosis not present

## 2018-04-09 DIAGNOSIS — H903 Sensorineural hearing loss, bilateral: Secondary | ICD-10-CM | POA: Diagnosis not present

## 2018-04-24 DIAGNOSIS — I10 Essential (primary) hypertension: Secondary | ICD-10-CM | POA: Diagnosis not present

## 2018-04-24 DIAGNOSIS — E034 Atrophy of thyroid (acquired): Secondary | ICD-10-CM | POA: Diagnosis not present

## 2018-05-01 DIAGNOSIS — N183 Chronic kidney disease, stage 3 (moderate): Secondary | ICD-10-CM | POA: Diagnosis not present

## 2018-05-01 DIAGNOSIS — E782 Mixed hyperlipidemia: Secondary | ICD-10-CM | POA: Diagnosis not present

## 2018-05-01 DIAGNOSIS — E034 Atrophy of thyroid (acquired): Secondary | ICD-10-CM | POA: Diagnosis not present

## 2018-05-01 DIAGNOSIS — G4733 Obstructive sleep apnea (adult) (pediatric): Secondary | ICD-10-CM | POA: Diagnosis not present

## 2018-05-01 DIAGNOSIS — I10 Essential (primary) hypertension: Secondary | ICD-10-CM | POA: Diagnosis not present

## 2018-05-01 DIAGNOSIS — Z0000 Encounter for general adult medical examination without abnormal findings: Secondary | ICD-10-CM | POA: Diagnosis not present

## 2018-05-07 DIAGNOSIS — H903 Sensorineural hearing loss, bilateral: Secondary | ICD-10-CM | POA: Diagnosis not present

## 2018-08-09 DIAGNOSIS — M79642 Pain in left hand: Secondary | ICD-10-CM | POA: Diagnosis not present

## 2018-09-14 DIAGNOSIS — M654 Radial styloid tenosynovitis [de Quervain]: Secondary | ICD-10-CM | POA: Diagnosis not present

## 2018-10-23 DIAGNOSIS — I10 Essential (primary) hypertension: Secondary | ICD-10-CM | POA: Diagnosis not present

## 2018-10-23 DIAGNOSIS — E782 Mixed hyperlipidemia: Secondary | ICD-10-CM | POA: Diagnosis not present

## 2018-10-23 DIAGNOSIS — E034 Atrophy of thyroid (acquired): Secondary | ICD-10-CM | POA: Diagnosis not present

## 2018-11-02 DIAGNOSIS — E034 Atrophy of thyroid (acquired): Secondary | ICD-10-CM | POA: Diagnosis not present

## 2018-11-02 DIAGNOSIS — G4733 Obstructive sleep apnea (adult) (pediatric): Secondary | ICD-10-CM | POA: Diagnosis not present

## 2018-11-02 DIAGNOSIS — Z0001 Encounter for general adult medical examination with abnormal findings: Secondary | ICD-10-CM | POA: Diagnosis not present

## 2018-11-02 DIAGNOSIS — I10 Essential (primary) hypertension: Secondary | ICD-10-CM | POA: Diagnosis not present

## 2018-11-02 DIAGNOSIS — E782 Mixed hyperlipidemia: Secondary | ICD-10-CM | POA: Diagnosis not present

## 2018-11-02 DIAGNOSIS — N183 Chronic kidney disease, stage 3 (moderate): Secondary | ICD-10-CM | POA: Diagnosis not present

## 2018-11-06 DIAGNOSIS — R319 Hematuria, unspecified: Secondary | ICD-10-CM | POA: Diagnosis not present

## 2018-11-06 DIAGNOSIS — N183 Chronic kidney disease, stage 3 (moderate): Secondary | ICD-10-CM | POA: Diagnosis not present

## 2018-11-06 DIAGNOSIS — R809 Proteinuria, unspecified: Secondary | ICD-10-CM | POA: Diagnosis not present

## 2018-11-06 DIAGNOSIS — I10 Essential (primary) hypertension: Secondary | ICD-10-CM | POA: Diagnosis not present

## 2018-11-13 DIAGNOSIS — R809 Proteinuria, unspecified: Secondary | ICD-10-CM | POA: Diagnosis not present

## 2018-11-13 DIAGNOSIS — I129 Hypertensive chronic kidney disease with stage 1 through stage 4 chronic kidney disease, or unspecified chronic kidney disease: Secondary | ICD-10-CM | POA: Diagnosis not present

## 2018-11-13 DIAGNOSIS — R319 Hematuria, unspecified: Secondary | ICD-10-CM | POA: Diagnosis not present

## 2018-11-13 DIAGNOSIS — N183 Chronic kidney disease, stage 3 (moderate): Secondary | ICD-10-CM | POA: Diagnosis not present

## 2019-02-26 DIAGNOSIS — Z8719 Personal history of other diseases of the digestive system: Secondary | ICD-10-CM | POA: Diagnosis not present

## 2019-04-24 DIAGNOSIS — E1122 Type 2 diabetes mellitus with diabetic chronic kidney disease: Secondary | ICD-10-CM | POA: Diagnosis not present

## 2019-04-24 DIAGNOSIS — I129 Hypertensive chronic kidney disease with stage 1 through stage 4 chronic kidney disease, or unspecified chronic kidney disease: Secondary | ICD-10-CM | POA: Diagnosis not present

## 2019-04-24 DIAGNOSIS — R809 Proteinuria, unspecified: Secondary | ICD-10-CM | POA: Diagnosis not present

## 2019-04-24 DIAGNOSIS — E559 Vitamin D deficiency, unspecified: Secondary | ICD-10-CM | POA: Diagnosis not present

## 2019-04-24 DIAGNOSIS — N2581 Secondary hyperparathyroidism of renal origin: Secondary | ICD-10-CM | POA: Diagnosis not present

## 2019-04-24 DIAGNOSIS — N183 Chronic kidney disease, stage 3 unspecified: Secondary | ICD-10-CM | POA: Diagnosis not present

## 2019-04-24 DIAGNOSIS — I10 Essential (primary) hypertension: Secondary | ICD-10-CM | POA: Diagnosis not present

## 2019-04-29 DIAGNOSIS — N1831 Chronic kidney disease, stage 3a: Secondary | ICD-10-CM | POA: Diagnosis not present

## 2019-04-29 DIAGNOSIS — I129 Hypertensive chronic kidney disease with stage 1 through stage 4 chronic kidney disease, or unspecified chronic kidney disease: Secondary | ICD-10-CM | POA: Diagnosis not present

## 2019-04-29 DIAGNOSIS — N2581 Secondary hyperparathyroidism of renal origin: Secondary | ICD-10-CM | POA: Diagnosis not present

## 2019-04-29 DIAGNOSIS — R319 Hematuria, unspecified: Secondary | ICD-10-CM | POA: Diagnosis not present

## 2019-04-29 DIAGNOSIS — E1122 Type 2 diabetes mellitus with diabetic chronic kidney disease: Secondary | ICD-10-CM | POA: Diagnosis not present

## 2019-04-29 DIAGNOSIS — R809 Proteinuria, unspecified: Secondary | ICD-10-CM | POA: Diagnosis not present

## 2019-04-30 DIAGNOSIS — E034 Atrophy of thyroid (acquired): Secondary | ICD-10-CM | POA: Diagnosis not present

## 2019-04-30 DIAGNOSIS — N183 Chronic kidney disease, stage 3 unspecified: Secondary | ICD-10-CM | POA: Diagnosis not present

## 2019-05-15 DIAGNOSIS — Z0000 Encounter for general adult medical examination without abnormal findings: Secondary | ICD-10-CM | POA: Diagnosis not present

## 2019-05-15 DIAGNOSIS — N1832 Chronic kidney disease, stage 3b: Secondary | ICD-10-CM | POA: Diagnosis not present

## 2019-05-15 DIAGNOSIS — E782 Mixed hyperlipidemia: Secondary | ICD-10-CM | POA: Diagnosis not present

## 2019-05-15 DIAGNOSIS — I10 Essential (primary) hypertension: Secondary | ICD-10-CM | POA: Diagnosis not present

## 2019-05-15 DIAGNOSIS — E034 Atrophy of thyroid (acquired): Secondary | ICD-10-CM | POA: Diagnosis not present

## 2019-05-15 DIAGNOSIS — G4733 Obstructive sleep apnea (adult) (pediatric): Secondary | ICD-10-CM | POA: Diagnosis not present

## 2019-10-22 DIAGNOSIS — N1831 Chronic kidney disease, stage 3a: Secondary | ICD-10-CM | POA: Diagnosis not present

## 2019-10-22 DIAGNOSIS — I129 Hypertensive chronic kidney disease with stage 1 through stage 4 chronic kidney disease, or unspecified chronic kidney disease: Secondary | ICD-10-CM | POA: Diagnosis not present

## 2019-10-22 DIAGNOSIS — R809 Proteinuria, unspecified: Secondary | ICD-10-CM | POA: Diagnosis not present

## 2019-10-22 DIAGNOSIS — R319 Hematuria, unspecified: Secondary | ICD-10-CM | POA: Diagnosis not present

## 2019-10-22 DIAGNOSIS — E1122 Type 2 diabetes mellitus with diabetic chronic kidney disease: Secondary | ICD-10-CM | POA: Diagnosis not present

## 2019-10-22 DIAGNOSIS — N2581 Secondary hyperparathyroidism of renal origin: Secondary | ICD-10-CM | POA: Diagnosis not present

## 2019-10-30 DIAGNOSIS — N1832 Chronic kidney disease, stage 3b: Secondary | ICD-10-CM | POA: Diagnosis not present

## 2019-10-30 DIAGNOSIS — R319 Hematuria, unspecified: Secondary | ICD-10-CM | POA: Diagnosis not present

## 2019-10-30 DIAGNOSIS — I129 Hypertensive chronic kidney disease with stage 1 through stage 4 chronic kidney disease, or unspecified chronic kidney disease: Secondary | ICD-10-CM | POA: Diagnosis not present

## 2019-10-30 DIAGNOSIS — N2581 Secondary hyperparathyroidism of renal origin: Secondary | ICD-10-CM | POA: Diagnosis not present

## 2019-10-30 DIAGNOSIS — R809 Proteinuria, unspecified: Secondary | ICD-10-CM | POA: Diagnosis not present

## 2019-10-30 DIAGNOSIS — E1122 Type 2 diabetes mellitus with diabetic chronic kidney disease: Secondary | ICD-10-CM | POA: Diagnosis not present

## 2019-11-12 DIAGNOSIS — E034 Atrophy of thyroid (acquired): Secondary | ICD-10-CM | POA: Diagnosis not present

## 2019-11-12 DIAGNOSIS — E782 Mixed hyperlipidemia: Secondary | ICD-10-CM | POA: Diagnosis not present

## 2019-11-12 DIAGNOSIS — N1832 Chronic kidney disease, stage 3b: Secondary | ICD-10-CM | POA: Diagnosis not present

## 2019-11-19 DIAGNOSIS — E034 Atrophy of thyroid (acquired): Secondary | ICD-10-CM | POA: Diagnosis not present

## 2019-11-19 DIAGNOSIS — N1832 Chronic kidney disease, stage 3b: Secondary | ICD-10-CM | POA: Diagnosis not present

## 2019-11-19 DIAGNOSIS — I10 Essential (primary) hypertension: Secondary | ICD-10-CM | POA: Diagnosis not present

## 2019-11-19 DIAGNOSIS — E782 Mixed hyperlipidemia: Secondary | ICD-10-CM | POA: Diagnosis not present

## 2019-11-19 DIAGNOSIS — Z0001 Encounter for general adult medical examination with abnormal findings: Secondary | ICD-10-CM | POA: Diagnosis not present

## 2019-11-19 DIAGNOSIS — G4733 Obstructive sleep apnea (adult) (pediatric): Secondary | ICD-10-CM | POA: Diagnosis not present

## 2020-05-06 DIAGNOSIS — E1122 Type 2 diabetes mellitus with diabetic chronic kidney disease: Secondary | ICD-10-CM | POA: Diagnosis not present

## 2020-05-06 DIAGNOSIS — R319 Hematuria, unspecified: Secondary | ICD-10-CM | POA: Diagnosis not present

## 2020-05-06 DIAGNOSIS — N1832 Chronic kidney disease, stage 3b: Secondary | ICD-10-CM | POA: Diagnosis not present

## 2020-05-06 DIAGNOSIS — R809 Proteinuria, unspecified: Secondary | ICD-10-CM | POA: Diagnosis not present

## 2020-05-06 DIAGNOSIS — I129 Hypertensive chronic kidney disease with stage 1 through stage 4 chronic kidney disease, or unspecified chronic kidney disease: Secondary | ICD-10-CM | POA: Diagnosis not present

## 2020-05-06 DIAGNOSIS — N2581 Secondary hyperparathyroidism of renal origin: Secondary | ICD-10-CM | POA: Diagnosis not present

## 2020-05-13 DIAGNOSIS — R809 Proteinuria, unspecified: Secondary | ICD-10-CM | POA: Diagnosis not present

## 2020-05-13 DIAGNOSIS — N1832 Chronic kidney disease, stage 3b: Secondary | ICD-10-CM | POA: Diagnosis not present

## 2020-05-13 DIAGNOSIS — I129 Hypertensive chronic kidney disease with stage 1 through stage 4 chronic kidney disease, or unspecified chronic kidney disease: Secondary | ICD-10-CM | POA: Diagnosis not present

## 2020-05-13 DIAGNOSIS — E1122 Type 2 diabetes mellitus with diabetic chronic kidney disease: Secondary | ICD-10-CM | POA: Diagnosis not present

## 2020-05-13 DIAGNOSIS — N2581 Secondary hyperparathyroidism of renal origin: Secondary | ICD-10-CM | POA: Diagnosis not present

## 2020-05-19 DIAGNOSIS — E034 Atrophy of thyroid (acquired): Secondary | ICD-10-CM | POA: Diagnosis not present

## 2020-05-19 DIAGNOSIS — N1832 Chronic kidney disease, stage 3b: Secondary | ICD-10-CM | POA: Diagnosis not present

## 2020-05-26 DIAGNOSIS — N1832 Chronic kidney disease, stage 3b: Secondary | ICD-10-CM | POA: Diagnosis not present

## 2020-05-26 DIAGNOSIS — M109 Gout, unspecified: Secondary | ICD-10-CM | POA: Diagnosis not present

## 2020-05-26 DIAGNOSIS — Z0000 Encounter for general adult medical examination without abnormal findings: Secondary | ICD-10-CM | POA: Diagnosis not present

## 2020-05-26 DIAGNOSIS — E034 Atrophy of thyroid (acquired): Secondary | ICD-10-CM | POA: Diagnosis not present

## 2020-05-26 DIAGNOSIS — E782 Mixed hyperlipidemia: Secondary | ICD-10-CM | POA: Diagnosis not present

## 2020-05-26 DIAGNOSIS — I10 Essential (primary) hypertension: Secondary | ICD-10-CM | POA: Diagnosis not present

## 2020-05-26 DIAGNOSIS — G4733 Obstructive sleep apnea (adult) (pediatric): Secondary | ICD-10-CM | POA: Diagnosis not present

## 2020-07-01 DIAGNOSIS — Z8719 Personal history of other diseases of the digestive system: Secondary | ICD-10-CM | POA: Diagnosis not present

## 2020-11-10 DIAGNOSIS — R809 Proteinuria, unspecified: Secondary | ICD-10-CM | POA: Diagnosis not present

## 2020-11-10 DIAGNOSIS — I129 Hypertensive chronic kidney disease with stage 1 through stage 4 chronic kidney disease, or unspecified chronic kidney disease: Secondary | ICD-10-CM | POA: Diagnosis not present

## 2020-11-10 DIAGNOSIS — N2581 Secondary hyperparathyroidism of renal origin: Secondary | ICD-10-CM | POA: Diagnosis not present

## 2020-11-10 DIAGNOSIS — N1832 Chronic kidney disease, stage 3b: Secondary | ICD-10-CM | POA: Diagnosis not present

## 2020-11-10 DIAGNOSIS — E1122 Type 2 diabetes mellitus with diabetic chronic kidney disease: Secondary | ICD-10-CM | POA: Diagnosis not present

## 2020-11-18 DIAGNOSIS — E1122 Type 2 diabetes mellitus with diabetic chronic kidney disease: Secondary | ICD-10-CM | POA: Diagnosis not present

## 2020-11-18 DIAGNOSIS — R809 Proteinuria, unspecified: Secondary | ICD-10-CM | POA: Diagnosis not present

## 2020-11-18 DIAGNOSIS — I129 Hypertensive chronic kidney disease with stage 1 through stage 4 chronic kidney disease, or unspecified chronic kidney disease: Secondary | ICD-10-CM | POA: Diagnosis not present

## 2020-11-18 DIAGNOSIS — R319 Hematuria, unspecified: Secondary | ICD-10-CM | POA: Diagnosis not present

## 2020-11-18 DIAGNOSIS — N2581 Secondary hyperparathyroidism of renal origin: Secondary | ICD-10-CM | POA: Diagnosis not present

## 2020-11-18 DIAGNOSIS — N1832 Chronic kidney disease, stage 3b: Secondary | ICD-10-CM | POA: Diagnosis not present

## 2020-11-19 DIAGNOSIS — N1832 Chronic kidney disease, stage 3b: Secondary | ICD-10-CM | POA: Diagnosis not present

## 2020-11-19 DIAGNOSIS — E782 Mixed hyperlipidemia: Secondary | ICD-10-CM | POA: Diagnosis not present

## 2020-11-19 DIAGNOSIS — E034 Atrophy of thyroid (acquired): Secondary | ICD-10-CM | POA: Diagnosis not present

## 2020-11-26 DIAGNOSIS — Z0001 Encounter for general adult medical examination with abnormal findings: Secondary | ICD-10-CM | POA: Diagnosis not present

## 2020-11-26 DIAGNOSIS — N1832 Chronic kidney disease, stage 3b: Secondary | ICD-10-CM | POA: Diagnosis not present

## 2020-11-26 DIAGNOSIS — Z23 Encounter for immunization: Secondary | ICD-10-CM | POA: Diagnosis not present

## 2020-11-26 DIAGNOSIS — E782 Mixed hyperlipidemia: Secondary | ICD-10-CM | POA: Diagnosis not present

## 2020-11-26 DIAGNOSIS — I10 Essential (primary) hypertension: Secondary | ICD-10-CM | POA: Diagnosis not present

## 2020-11-26 DIAGNOSIS — G4733 Obstructive sleep apnea (adult) (pediatric): Secondary | ICD-10-CM | POA: Diagnosis not present

## 2020-11-26 DIAGNOSIS — E034 Atrophy of thyroid (acquired): Secondary | ICD-10-CM | POA: Diagnosis not present

## 2020-12-04 DIAGNOSIS — Z23 Encounter for immunization: Secondary | ICD-10-CM | POA: Diagnosis not present

## 2020-12-04 DIAGNOSIS — S51811A Laceration without foreign body of right forearm, initial encounter: Secondary | ICD-10-CM | POA: Diagnosis not present

## 2020-12-08 DIAGNOSIS — Z23 Encounter for immunization: Secondary | ICD-10-CM | POA: Diagnosis not present

## 2021-03-01 DIAGNOSIS — M10072 Idiopathic gout, left ankle and foot: Secondary | ICD-10-CM | POA: Diagnosis not present

## 2021-05-03 DIAGNOSIS — H5203 Hypermetropia, bilateral: Secondary | ICD-10-CM | POA: Diagnosis not present

## 2021-05-03 DIAGNOSIS — H52223 Regular astigmatism, bilateral: Secondary | ICD-10-CM | POA: Diagnosis not present

## 2021-05-03 DIAGNOSIS — H2513 Age-related nuclear cataract, bilateral: Secondary | ICD-10-CM | POA: Diagnosis not present

## 2021-05-03 DIAGNOSIS — Z135 Encounter for screening for eye and ear disorders: Secondary | ICD-10-CM | POA: Diagnosis not present

## 2021-05-11 DIAGNOSIS — R809 Proteinuria, unspecified: Secondary | ICD-10-CM | POA: Diagnosis not present

## 2021-05-11 DIAGNOSIS — R319 Hematuria, unspecified: Secondary | ICD-10-CM | POA: Diagnosis not present

## 2021-05-11 DIAGNOSIS — H2513 Age-related nuclear cataract, bilateral: Secondary | ICD-10-CM | POA: Diagnosis not present

## 2021-05-11 DIAGNOSIS — N2581 Secondary hyperparathyroidism of renal origin: Secondary | ICD-10-CM | POA: Diagnosis not present

## 2021-05-11 DIAGNOSIS — H25013 Cortical age-related cataract, bilateral: Secondary | ICD-10-CM | POA: Diagnosis not present

## 2021-05-11 DIAGNOSIS — H2511 Age-related nuclear cataract, right eye: Secondary | ICD-10-CM | POA: Diagnosis not present

## 2021-05-11 DIAGNOSIS — H18413 Arcus senilis, bilateral: Secondary | ICD-10-CM | POA: Diagnosis not present

## 2021-05-11 DIAGNOSIS — H25043 Posterior subcapsular polar age-related cataract, bilateral: Secondary | ICD-10-CM | POA: Diagnosis not present

## 2021-05-11 DIAGNOSIS — I129 Hypertensive chronic kidney disease with stage 1 through stage 4 chronic kidney disease, or unspecified chronic kidney disease: Secondary | ICD-10-CM | POA: Diagnosis not present

## 2021-05-11 DIAGNOSIS — E1122 Type 2 diabetes mellitus with diabetic chronic kidney disease: Secondary | ICD-10-CM | POA: Diagnosis not present

## 2021-05-11 DIAGNOSIS — N1832 Chronic kidney disease, stage 3b: Secondary | ICD-10-CM | POA: Diagnosis not present

## 2021-05-17 DIAGNOSIS — H25011 Cortical age-related cataract, right eye: Secondary | ICD-10-CM | POA: Diagnosis not present

## 2021-05-17 DIAGNOSIS — H2511 Age-related nuclear cataract, right eye: Secondary | ICD-10-CM | POA: Diagnosis not present

## 2021-05-18 DIAGNOSIS — H2512 Age-related nuclear cataract, left eye: Secondary | ICD-10-CM | POA: Diagnosis not present

## 2021-05-24 DIAGNOSIS — R809 Proteinuria, unspecified: Secondary | ICD-10-CM | POA: Diagnosis not present

## 2021-05-24 DIAGNOSIS — N1832 Chronic kidney disease, stage 3b: Secondary | ICD-10-CM | POA: Diagnosis not present

## 2021-05-24 DIAGNOSIS — R319 Hematuria, unspecified: Secondary | ICD-10-CM | POA: Diagnosis not present

## 2021-05-24 DIAGNOSIS — N2581 Secondary hyperparathyroidism of renal origin: Secondary | ICD-10-CM | POA: Diagnosis not present

## 2021-05-24 DIAGNOSIS — I129 Hypertensive chronic kidney disease with stage 1 through stage 4 chronic kidney disease, or unspecified chronic kidney disease: Secondary | ICD-10-CM | POA: Diagnosis not present

## 2021-05-24 DIAGNOSIS — M1000 Idiopathic gout, unspecified site: Secondary | ICD-10-CM | POA: Diagnosis not present

## 2021-05-24 DIAGNOSIS — E1122 Type 2 diabetes mellitus with diabetic chronic kidney disease: Secondary | ICD-10-CM | POA: Diagnosis not present

## 2021-05-28 DIAGNOSIS — E034 Atrophy of thyroid (acquired): Secondary | ICD-10-CM | POA: Diagnosis not present

## 2021-05-28 DIAGNOSIS — N1832 Chronic kidney disease, stage 3b: Secondary | ICD-10-CM | POA: Diagnosis not present

## 2021-05-28 DIAGNOSIS — E782 Mixed hyperlipidemia: Secondary | ICD-10-CM | POA: Diagnosis not present

## 2021-05-31 DIAGNOSIS — Z9842 Cataract extraction status, left eye: Secondary | ICD-10-CM | POA: Diagnosis not present

## 2021-05-31 DIAGNOSIS — H25012 Cortical age-related cataract, left eye: Secondary | ICD-10-CM | POA: Diagnosis not present

## 2021-05-31 DIAGNOSIS — H2512 Age-related nuclear cataract, left eye: Secondary | ICD-10-CM | POA: Diagnosis not present

## 2021-06-04 DIAGNOSIS — Z0000 Encounter for general adult medical examination without abnormal findings: Secondary | ICD-10-CM | POA: Diagnosis not present

## 2021-06-04 DIAGNOSIS — E034 Atrophy of thyroid (acquired): Secondary | ICD-10-CM | POA: Diagnosis not present

## 2021-06-04 DIAGNOSIS — N1832 Chronic kidney disease, stage 3b: Secondary | ICD-10-CM | POA: Diagnosis not present

## 2021-06-04 DIAGNOSIS — G4733 Obstructive sleep apnea (adult) (pediatric): Secondary | ICD-10-CM | POA: Diagnosis not present

## 2021-06-04 DIAGNOSIS — I10 Essential (primary) hypertension: Secondary | ICD-10-CM | POA: Diagnosis not present

## 2021-06-04 DIAGNOSIS — E782 Mixed hyperlipidemia: Secondary | ICD-10-CM | POA: Diagnosis not present

## 2021-06-04 DIAGNOSIS — Z20822 Contact with and (suspected) exposure to covid-19: Secondary | ICD-10-CM | POA: Diagnosis not present

## 2021-07-01 DIAGNOSIS — Z8719 Personal history of other diseases of the digestive system: Secondary | ICD-10-CM | POA: Diagnosis not present

## 2021-08-09 DIAGNOSIS — N1832 Chronic kidney disease, stage 3b: Secondary | ICD-10-CM | POA: Diagnosis not present

## 2021-08-09 DIAGNOSIS — N2581 Secondary hyperparathyroidism of renal origin: Secondary | ICD-10-CM | POA: Diagnosis not present

## 2021-08-09 DIAGNOSIS — I10 Essential (primary) hypertension: Secondary | ICD-10-CM | POA: Diagnosis not present

## 2021-08-09 DIAGNOSIS — R310 Gross hematuria: Secondary | ICD-10-CM | POA: Diagnosis not present

## 2021-08-10 ENCOUNTER — Other Ambulatory Visit: Payer: Self-pay | Admitting: Nephrology

## 2021-08-10 DIAGNOSIS — I10 Essential (primary) hypertension: Secondary | ICD-10-CM

## 2021-08-10 DIAGNOSIS — R310 Gross hematuria: Secondary | ICD-10-CM

## 2021-08-10 DIAGNOSIS — N1832 Chronic kidney disease, stage 3b: Secondary | ICD-10-CM

## 2021-08-25 ENCOUNTER — Ambulatory Visit
Admission: RE | Admit: 2021-08-25 | Discharge: 2021-08-25 | Disposition: A | Discharge status: Home / Self Care | Payer: PPO | Source: Ambulatory Visit | Attending: Nephrology | Admitting: Nephrology

## 2021-08-25 DIAGNOSIS — N1832 Chronic kidney disease, stage 3b: Secondary | ICD-10-CM | POA: Insufficient documentation

## 2021-08-25 DIAGNOSIS — R109 Unspecified abdominal pain: Secondary | ICD-10-CM | POA: Diagnosis not present

## 2021-08-25 DIAGNOSIS — R319 Hematuria, unspecified: Secondary | ICD-10-CM | POA: Diagnosis not present

## 2021-08-25 DIAGNOSIS — I10 Essential (primary) hypertension: Secondary | ICD-10-CM | POA: Insufficient documentation

## 2021-08-25 DIAGNOSIS — R310 Gross hematuria: Secondary | ICD-10-CM | POA: Insufficient documentation

## 2021-09-17 ENCOUNTER — Encounter (INDEPENDENT_AMBULATORY_CARE_PROVIDER_SITE_OTHER): Payer: Self-pay | Admitting: Vascular Surgery

## 2021-09-17 ENCOUNTER — Ambulatory Visit (INDEPENDENT_AMBULATORY_CARE_PROVIDER_SITE_OTHER): Payer: HMO | Admitting: Vascular Surgery

## 2021-09-17 DIAGNOSIS — I10 Essential (primary) hypertension: Secondary | ICD-10-CM | POA: Diagnosis not present

## 2021-09-17 DIAGNOSIS — I7143 Infrarenal abdominal aortic aneurysm, without rupture: Secondary | ICD-10-CM | POA: Diagnosis not present

## 2021-09-17 DIAGNOSIS — I714 Abdominal aortic aneurysm, without rupture, unspecified: Secondary | ICD-10-CM | POA: Insufficient documentation

## 2021-09-17 NOTE — Assessment & Plan Note (Addendum)
blood pressure control important in reducing the progression of atherosclerotic disease and aneurysmal growth. On appropriate oral medications.  

## 2021-09-17 NOTE — Progress Notes (Signed)
Patient ID: [name], male   DOB: March 14, 1941, 81 y.o.   MRN: [id number]  Chief Complaint  Patient presents with   Establish Care    Referred by Dr Thedore Mins    HPI [name] is a 81 y.o. male.  I am asked to see the patient by Dr. Rexene Edison. Singh for evaluation of an abdominal aortic aneurysm.  The patient was having hematuria and had an uninfused CT scan of the abdomen and pelvis.  The hematuria was found to be a bladder infection and has resolved.  The patient denies any obvious aneurysm related symptoms. Specifically, the patient denies new back or abdominal pain, or signs of peripheral embolization.   The patient has undergone a CT scan of the abdomen pelvis which I have independently reviewed.  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  Both external iliac arteries normalized.  The patient does report being told several years ago that he had a very small aneurysm that he thought was in the top of his leg arteries but he was not really sure.  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     ergocalciferol (VITAMIN D2) 1.25 MG (50000 UT) capsule       levothyroxine (SYNTHROID) 50 MCG tablet TAKE 1 TABLET EVERY DAY ON EMPTY STOMACHWITH A GLASS OF WATER AT LEAST 30-60 MINBEFORE BREAKFAST     lisinopril (ZESTRIL) 10 MG tablet Take 1 tablet by mouth daily.     No current facility-administered medications for this visit.      REVIEW OF SYSTEMS (Negative unless checked)  Constitutional: [] Weight loss  [] Fever  [] Chills Cardiac: [] Chest pain   [] Chest pressure   [] Palpitations   [] Shortness of breath when laying flat   [] Shortness of breath at rest   [] Shortness of breath with exertion. Vascular:  [] Pain in legs with walking   [] Pain in legs at rest   [] Pain in legs when laying flat   [] Claudication   [] Pain in feet when walking  [] Pain in feet at rest  [] Pain in feet when laying flat   [] History of DVT   [] Phlebitis   [] Swelling in legs   [] Varicose veins   [] Non-healing ulcers Pulmonary:   [] Uses home oxygen   [] Productive cough   [] Hemoptysis   [] Wheeze  [] COPD   [] Asthma Neurologic:  [] Dizziness  [] Blackouts   [] Seizures   [] History of stroke   [] History of TIA  [] Aphasia   [] Temporary blindness   [] Dysphagia   [] Weakness or numbness in arms   [] Weakness or numbness in legs Musculoskeletal:  [x] Arthritis   [] Joint swelling   [] Joint pain   []   Low back pain Hematologic:  [] Easy bruising  [] Easy bleeding   [] Hypercoagulable state   [] Anemic  [] Hepatitis Gastrointestinal:  [] Blood in stool   [] Vomiting blood  [] Gastroesophageal reflux/heartburn   [] Abdominal pain Genitourinary:  [] Chronic kidney disease   [] Difficult urination  [] Frequent urination  [] Burning with urination   [x] Hematuria Skin:  [] Rashes   [] Ulcers   [] Wounds Psychological:  [] History of anxiety   []  History of major depression.    Physical Exam BP (!) 156/74 (BP Location: Right Arm)   Pulse 76   Resp 17   Ht 5\' 4"  (1.626 m)   Wt 163 lb (73.9 kg)   BMI 27.98 kg/m  Gen:  WD/WN, NAD.  Appears younger than stated age Head: [place]/AT, No temporalis wasting. Prominent temp  pulse not noted. Ear/Nose/Throat: Hearing grossly intact, nares w/o erythema or drainage, oropharynx w/o Erythema/Exudate Eyes: Conjunctiva clear, sclera non-icteric  Neck: trachea midline.  No JVD.  Pulmonary:  Good air movement, respirations not labored, no use of accessory muscles  Cardiac: RRR, no JVD Vascular:  Vessel Right Left  Radial Palpable Palpable                                   Gastrointestinal:. No masses, surgical incisions, or scars.  No appreciable increased aortic impulse Musculoskeletal: M/S 5/5 throughout.  Extremities without ischemic changes.  No deformity or atrophy.  No significant lower extremity edema. Neurologic: Sensation grossly intact in extremities.  Symmetrical.  Speech is fluent. Motor exam as listed above. Psychiatric: Judgment intact, Mood & affect appropriate for pt's clinical situation. Dermatologic: No rashes or ulcers noted.  No cellulitis or open wounds.    Radiology CT ABDOMEN PELVIS WO CONTRAST  Result Date: 08/25/2021 CLINICAL DATA:  Hematuria flank pain x2 weeks EXAM: CT ABDOMEN AND PELVIS WITHOUT CONTRAST TECHNIQUE: Multidetector CT imaging of the abdomen and pelvis was performed following the standard protocol without IV contrast. RADIATION DOSE REDUCTION: This exam was performed according to the departmental dose-optimization program which includes automated exposure control, adjustment of the mA and/or kV according to patient size and/or use of iterative reconstruction technique. COMPARISON:  08/13/2014 FINDINGS: Lower chest: There is slight prominence of interstitial markings in the periphery of lower lung fields suggesting scarring. Coronary artery calcifications are seen. Hepatobiliary: No focal abnormality is seen in the liver. There is no dilation of bile ducts. Gallbladder is unremarkable. Pancreas: No focal abnormality is seen. Spleen: Unremarkable. Adrenals/Urinary Tract: Adrenals are not enlarged. There is no hydronephrosis. There  are no renal or ureteral stones. Urinary bladder is not distended. Stomach/Bowel: Stomach is not distended. Small bowel loops are not dilated. Appendix is slightly larger than usual in caliber measuring 8 mm. There is air in the lumen of appendix. There is no pericecal stranding. Numerous diverticula are seen in the colon. There is no evidence of focal acute diverticulitis. Surgical clips are seen in the pelvis. Vascular/Lymphatic: There is 3.4 x 3.1 cm infrarenal aortic aneurysm. This aneurysm measures 3 cm in diameter in the previous study. There is 2 cm aneurysm in the left common iliac artery. There is 1.7 cm aneurysm in the right common iliac artery. Fairly extensive calcifications are seen in the aorta and its major branches. Reproductive: Prostate is enlarged. There is asymmetric prominence of right side of the prostate which appears to be causing extrinsic pressure over the prostatic urethra. There are scattered coarse calcifications in the prostate. Other: There is  no ascites or pneumoperitoneum. Small umbilical hernia containing fat is seen. Bilateral inguinal hernias containing fat are seen, larger on the left side. Musculoskeletal: Schmorl's nodes are seen in the body L4 vertebra. There is minimal retrolisthesis at L4-L5 level. There is encroachment of neural foramina at multiple levels in the lumbar spine. IMPRESSION: There is no hydronephrosis. There are no renal or ureteral stones. There is no evidence of intestinal obstruction or pneumoperitoneum. There is 3.4 cm infrarenal aortic aneurysm. There is 2 cm aneurysm in the left common iliac artery. There is 1.7 cm aneurysm in the right common iliac artery. Vascular surgery consultation and follow-up CT every 3 years may be considered. Diverticulosis of colon without signs of focal diverticulitis. Coronary artery calcifications are seen. Lumbar spondylosis. Enlarged prostate. There is asymmetric prominence of right side of the prostate which appears to  be causing extrinsic pressure over the prostatic urethra. This may be due to benign prostatic hypertrophy or neoplastic process. Other findings as described in the body of the report. Electronically Signed   By: Ernie AvenaPalani  Rathinasamy M.D.   On: 08/25/2021 20:40    Labs No results found for this or any previous visit (from the past 2160 hour(s)).  Assessment/Plan:  AAA (abdominal aortic aneurysm) without rupture (HCC) The patient has undergone a CT scan of the abdomen pelvis which I have independently reviewed.  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  Both external iliac arteries normalized.  Recommend: No surgery or intervention is indicated at this time.  The patient has an asymptomatic abdominal aortic aneurysm that is less than 4 cm in maximal diameter.    I have reviewed the natural history of abdominal aortic aneurysm and the small risk of rupture for aneurysm less than 5 cm in size.  However, as these small aneurysms tend to enlarge over time, continued surveillance with ultrasound or CT scan is mandatory.   I have also discussed optimizing medical management with hypertension and lipid control and the importance of abstinence from tobacco.  The patient is also encouraged to exercise a minimum of 30 minutes 4 times a week.   Should the patient develop new onset abdominal or back pain or signs of peripheral embolization they are instructed to seek medical attention immediately and to alert the physician providing care that they have an aneurysm.   The patient voices their understanding.  The patient will return in 12 months with an aortic duplex.  He has also been told about leg aneurysms in the past, so we will perform arterial  duplex of the lower extremities to evaluate for femoral and popliteal aneurysms at that visit as well.    Essential hypertension blood pressure control important in reducing the progression of atherosclerotic disease and aneurysmal growth. On appropriate oral medications.      Festus BarrenJason Mikhia Dusek 09/17/2021, 11:19 AM   This note was created with Dragon medical transcription system.  Any errors from dictation are unintentional.

## 2021-09-17 NOTE — Patient Instructions (Signed)
Abdominal Aortic Aneurysm  An aneurysm is a bulge in a blood vessel that carries blood away from the heart (artery). It happens when blood pushes against a weak or damaged place on the wall of the blood vessel. An abdominal aortic aneurysm happens in the main blood vessel that carries blood away from the heart (aorta). Most aneurysms do not cause problems, but some do cause problems. If an aneurysm grows, it can burst or tear. This causes bleeding inside you. It is an emergency. It can be life-threatening. What are the causes? The exact cause of this condition is not known. What increases the risk? The following factors may make you more likely to develop this condition: Being male and 82 years of age or older. Being of [place] descent. Using nicotine or tobacco now or in the past. Having a family history of aneurysms. Having any of these problems: Hardening of blood vessels that carry blood away from the heart. Irritation and swelling of the walls of blood vessels that carry blood away from the heart. Certain genetic problems. Being very overweight. An infection in the wall of your aorta. High cholesterol. High blood pressure (hypertension). What are the signs or symptoms? Symptoms depend on the size of your aneurysm and how fast it is growing. Most aneurysms grow slowly and do not cause symptoms. If symptoms happen, you may: Have very bad pain in your belly (abdomen), side, or low back. Feel full after eating only a little food. Feel a throbbing lump in your belly. Have these problems with your feet or toes: Pain. Skin turning blue. Sores. Have trouble pooping (constipation). Have trouble peeing (urinating). If your aneurysm bursts, you may: Feel sudden, very bad pain in the belly, side, or back. Feel like you may vomit. Vomit. Feel light-headed. Faint. How is this treated? Treatment for this condition depends on: The size of your aneurysm. How fast it is  growing. Your age. Your risk of having the aneurysm burst. If your aneurysm is smaller than 2 inches (5 cm), your doctor may: Check it often to see if it is growing. You may have an imaging test (ultrasound) to check it every 3-6 months, every year, or every few years. Give you medicines for: High blood pressure. Pain. Infection. If your aneurysm is larger than 2 inches (5 cm), you may need surgery to fix it. Follow these instructions at home: Eating and drinking  Eat a heart-healthy diet. Eat a lot of: Fresh fruits and vegetables. Whole grains. Low-fat (lean) protein. Low-fat dairy products. Avoid foods that are high in saturated fat and cholesterol. These foods include red meat and some dairy products. Lifestyle     Do not use any products that contain nicotine or tobacco, such as cigarettes, e-cigarettes, and chewing tobacco. If you need help quitting, ask your doctor. Stay active and get exercise. Ask your doctor how often to exercise and what types of exercise are safe for you. Keep a healthy weight. Alcohol use Do not drink alcohol if: Your doctor tells you not to drink. You are pregnant, may be pregnant, or are planning to become pregnant. If you drink alcohol: Limit how much you use to: 0-1 drink a day for women. 0-2 drinks a day for men. Be aware of how much alcohol is in your drink. In the [place] one drink equals one 12 oz bottle of beer (355 mL), one 5 oz glass of wine (148 mL), or one 1 oz glass of hard liquor (44 mL). General instructions   Take over-the-counter and prescription medicines only as told by your doctor. Keep your blood pressure in a normal range. Check it at regular times. Ask your doctor what level it should be. Have regular checks of your levels of blood sugar (glucose) and cholesterol. Follow steps to keep these levels near normal. Avoid heavy lifting and activities that take a lot of effort. Ask what activities are safe for you. If you can, learn  your family's health history. Keep all follow-up visits as told by your doctor. This is important. Contact a doctor if: Your belly, side, or back hurts. Your belly throbs. You have a fever. Get help right away if: You have sudden, bad pain in your belly, side, or back. You feel like you may vomit or you vomit. You feel light-headed or you faint. Your heart beats fast when you stand. You have sweaty skin that is cold to the touch (clammy). You are short of breath. You have trouble pooping. You have trouble peeing. These symptoms may be an emergency. Do not wait to see if the symptoms will go away. Get medical help right away. Call your local emergency services (911 in the [place] Do not drive yourself to the hospital. Summary An aneurysm is a bulge in one of the blood vessels that carry blood away from the heart (artery). An abdominal aortic aneurysm happens in the main blood vessel that carries blood away from the heart (aorta). This condition can cause bleeding inside the body. It can be life-threatening. Risk can rise if you are male, age 82 or older, and of [place] descent. Risk can also rise from nicotine or tobacco use or having aneurysms in the family. Get help right away if you have symptoms of a burst aneurysm. This information is not intended to replace advice given to you by your health care provider. Make sure you discuss any questions you have with your health care provider. Document Revised: 01/25/2019 Document Reviewed: 01/25/2019 Elsevier Patient Education  2023 Else[employer] Inc.  

## 2021-09-17 NOTE — Assessment & Plan Note (Signed)
The patient has undergone a CT scan of the abdomen pelvis which I have independently reviewed.  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  Both external iliac arteries normalized.  Recommend: No surgery or intervention is indicated at this time.  The patient has an asymptomatic abdominal aortic aneurysm that is less than 4 cm in maximal diameter.    I have reviewed the natural history of abdominal aortic aneurysm and the small risk of rupture for aneurysm less than 5 cm in size.  However, as these small aneurysms tend to enlarge over time, continued surveillance with ultrasound or CT scan is mandatory.   I have also discussed optimizing medical management with hypertension and lipid control and the importance of abstinence from tobacco.  The patient is also encouraged to exercise a minimum of 30 minutes 4 times a week.   Should the patient develop new onset abdominal or back pain or signs of peripheral embolization they are instructed to seek medical attention immediately and to alert the physician providing care that they have an aneurysm.   The patient voices their understanding.  The patient will return in 12 months with an aortic duplex.  He has also been told about leg aneurysms in the past, so we will perform arterial duplex of the lower extremities to evaluate for femoral and popliteal aneurysms at that visit as well.

## 2021-12-02 DIAGNOSIS — E034 Atrophy of thyroid (acquired): Secondary | ICD-10-CM | POA: Diagnosis not present

## 2021-12-09 DIAGNOSIS — E782 Mixed hyperlipidemia: Secondary | ICD-10-CM | POA: Diagnosis not present

## 2021-12-09 DIAGNOSIS — N1832 Chronic kidney disease, stage 3b: Secondary | ICD-10-CM | POA: Diagnosis not present

## 2021-12-09 DIAGNOSIS — I10 Essential (primary) hypertension: Secondary | ICD-10-CM | POA: Diagnosis not present

## 2021-12-09 DIAGNOSIS — Z0001 Encounter for general adult medical examination with abnormal findings: Secondary | ICD-10-CM | POA: Diagnosis not present

## 2021-12-09 DIAGNOSIS — E034 Atrophy of thyroid (acquired): Secondary | ICD-10-CM | POA: Diagnosis not present

## 2022-06-08 DIAGNOSIS — I10 Essential (primary) hypertension: Secondary | ICD-10-CM | POA: Diagnosis not present

## 2022-06-08 DIAGNOSIS — E034 Atrophy of thyroid (acquired): Secondary | ICD-10-CM | POA: Diagnosis not present

## 2022-06-14 ENCOUNTER — Observation Stay (HOSPITAL_BASED_OUTPATIENT_CLINIC_OR_DEPARTMENT_OTHER)
Admit: 2022-06-14 | Discharge: 2022-06-14 | Disposition: A | Discharge status: Home / Self Care | Payer: PPO | Attending: Internal Medicine | Admitting: Internal Medicine

## 2022-06-14 ENCOUNTER — Encounter: Payer: Self-pay | Admitting: Radiology

## 2022-06-14 ENCOUNTER — Observation Stay
Admission: EM | Admit: 2022-06-14 | Discharge: 2022-06-15 | Disposition: A | Discharge status: Home / Self Care | Payer: PPO | Attending: Internal Medicine | Admitting: Internal Medicine

## 2022-06-14 ENCOUNTER — Other Ambulatory Visit: Payer: Self-pay

## 2022-06-14 ENCOUNTER — Emergency Department: Payer: PPO

## 2022-06-14 DIAGNOSIS — G459 Transient cerebral ischemic attack, unspecified: Secondary | ICD-10-CM | POA: Diagnosis not present

## 2022-06-14 DIAGNOSIS — H539 Unspecified visual disturbance: Secondary | ICD-10-CM | POA: Diagnosis not present

## 2022-06-14 DIAGNOSIS — R29898 Other symptoms and signs involving the musculoskeletal system: Secondary | ICD-10-CM | POA: Diagnosis not present

## 2022-06-14 DIAGNOSIS — Z79899 Other long term (current) drug therapy: Secondary | ICD-10-CM | POA: Insufficient documentation

## 2022-06-14 DIAGNOSIS — I10 Essential (primary) hypertension: Secondary | ICD-10-CM | POA: Insufficient documentation

## 2022-06-14 DIAGNOSIS — H53462 Homonymous bilateral field defects, left side: Secondary | ICD-10-CM | POA: Diagnosis not present

## 2022-06-14 DIAGNOSIS — I714 Abdominal aortic aneurysm, without rupture, unspecified: Secondary | ICD-10-CM | POA: Insufficient documentation

## 2022-06-14 DIAGNOSIS — I639 Cerebral infarction, unspecified: Secondary | ICD-10-CM | POA: Diagnosis not present

## 2022-06-14 DIAGNOSIS — R531 Weakness: Secondary | ICD-10-CM | POA: Diagnosis not present

## 2022-06-14 DIAGNOSIS — R2681 Unsteadiness on feet: Secondary | ICD-10-CM | POA: Insufficient documentation

## 2022-06-14 DIAGNOSIS — E039 Hypothyroidism, unspecified: Secondary | ICD-10-CM | POA: Insufficient documentation

## 2022-06-14 DIAGNOSIS — G319 Degenerative disease of nervous system, unspecified: Secondary | ICD-10-CM | POA: Diagnosis not present

## 2022-06-14 DIAGNOSIS — I6523 Occlusion and stenosis of bilateral carotid arteries: Secondary | ICD-10-CM | POA: Diagnosis not present

## 2022-06-14 DIAGNOSIS — H538 Other visual disturbances: Secondary | ICD-10-CM | POA: Diagnosis not present

## 2022-06-14 DIAGNOSIS — H53461 Homonymous bilateral field defects, right side: Secondary | ICD-10-CM | POA: Diagnosis not present

## 2022-06-14 HISTORY — DX: Essential (primary) hypertension: I10

## 2022-06-14 HISTORY — DX: Hypothyroidism, unspecified: E03.9

## 2022-06-14 LAB — DIFFERENTIAL
Abs Immature Granulocytes: 0.02 10*3/uL (ref 0.00–0.07)
Basophils Absolute: 0.1 10*3/uL (ref 0.0–0.1)
Basophils Relative: 1 %
Eosinophils Absolute: 0.1 10*3/uL (ref 0.0–0.5)
Eosinophils Relative: 1 %
Immature Granulocytes: 0 %
Lymphocytes Relative: 41 %
Lymphs Abs: 3.2 10*3/uL (ref 0.7–4.0)
Monocytes Absolute: 0.5 10*3/uL (ref 0.1–1.0)
Monocytes Relative: 6 %
Neutro Abs: 4 10*3/uL (ref 1.7–7.7)
Neutrophils Relative %: 51 %

## 2022-06-14 LAB — COMPREHENSIVE METABOLIC PANEL
ALT: 34 U/L (ref 0–44)
AST: 36 U/L (ref 15–41)
Albumin: 4.5 g/dL (ref 3.5–5.0)
Alkaline Phosphatase: 58 U/L (ref 38–126)
Anion gap: 8 (ref 5–15)
BUN: 19 mg/dL (ref 8–23)
CO2: 26 mmol/L (ref 22–32)
Calcium: 9.1 mg/dL (ref 8.9–10.3)
Chloride: 103 mmol/L (ref 98–111)
Creatinine, Ser: 1.33 mg/dL — ABNORMAL HIGH (ref 0.61–1.24)
GFR, Estimated: 53 mL/min — ABNORMAL LOW (ref >60)
Glucose, Bld: 105 mg/dL — ABNORMAL HIGH (ref 70–99)
Potassium: 3.8 mmol/L (ref 3.5–5.1)
Sodium: 137 mmol/L (ref 135–145)
Total Bilirubin: 0.8 mg/dL (ref 0.3–1.2)
Total Protein: 7.8 g/dL (ref 6.5–8.1)

## 2022-06-14 LAB — CBC
HCT: 51.1 % (ref 39.0–52.0)
Hemoglobin: 17 g/dL (ref 13.0–17.0)
MCH: 30.5 pg (ref 26.0–34.0)
MCHC: 33.3 g/dL (ref 30.0–36.0)
MCV: 91.7 fL (ref 80.0–100.0)
Platelets: 193 10*3/uL (ref 150–400)
RBC: 5.57 MIL/uL (ref 4.22–5.81)
RDW: 13.4 % (ref 11.5–15.5)
WBC: 7.8 10*3/uL (ref 4.0–10.5)
nRBC: 0 % (ref 0.0–0.2)

## 2022-06-14 LAB — ETHANOL: Alcohol, Ethyl (B): <10 mg/dL (ref <10)

## 2022-06-14 LAB — CBG MONITORING, ED: Glucose-Capillary: 118 mg/dL — ABNORMAL HIGH (ref 70–99)

## 2022-06-14 LAB — PROTIME-INR
INR: 1 (ref 0.8–1.2)
Prothrombin Time: 12.9 seconds (ref 11.4–15.2)

## 2022-06-14 LAB — APTT: aPTT: 29 seconds (ref 24–36)

## 2022-06-14 MED ORDER — ACETAMINOPHEN 160 MG/5ML PO SOLN: 650.0000 mg | ORAL | Status: DC | PRN

## 2022-06-14 MED ORDER — ACETAMINOPHEN 325 MG PO TABS: 650.0000 mg | ORAL_TABLET | ORAL | Status: DC | PRN

## 2022-06-14 MED ORDER — SODIUM CHLORIDE 0.9% FLUSH
3.0000 mL | Freq: Once | INTRAVENOUS | Status: AC
  Administered 2022-06-14: 3 mL via INTRAVENOUS

## 2022-06-14 MED ORDER — ENOXAPARIN SODIUM 40 MG/0.4ML IJ SOSY
40.0000 mg | PREFILLED_SYRINGE | INTRAMUSCULAR | Status: DC
  Administered 2022-06-14: 40 mg via SUBCUTANEOUS
  Filled 2022-06-14: qty 0.4

## 2022-06-14 MED ORDER — HYDRALAZINE HCL 20 MG/ML IJ SOLN: 5.0000 mg | Freq: Four times a day (QID) | INTRAMUSCULAR | Status: DC | PRN

## 2022-06-14 MED ORDER — LEVOTHYROXINE SODIUM 50 MCG PO TABS
50.0000 ug | ORAL_TABLET | Freq: Every day | ORAL | Status: DC
  Administered 2022-06-15: 50 ug via ORAL
  Filled 2022-06-14: qty 1

## 2022-06-14 MED ORDER — LABETALOL HCL 5 MG/ML IV SOLN: 5.0000 mg | INTRAVENOUS | Status: DC | PRN

## 2022-06-14 MED ORDER — ACETAMINOPHEN 650 MG RE SUPP: 650.0000 mg | RECTAL | Status: DC | PRN

## 2022-06-14 MED ORDER — ATORVASTATIN CALCIUM 20 MG PO TABS
80.0000 mg | ORAL_TABLET | Freq: Every day | ORAL | Status: DC
  Administered 2022-06-14: 80 mg via ORAL
  Filled 2022-06-14: qty 4

## 2022-06-14 MED ORDER — ATORVASTATIN CALCIUM 20 MG PO TABS: 80.0000 mg | ORAL_TABLET | Freq: Every day | ORAL | Status: DC

## 2022-06-14 MED ORDER — SENNOSIDES-DOCUSATE SODIUM 8.6-50 MG PO TABS: 1.0000 | ORAL_TABLET | Freq: Every evening | ORAL | Status: DC | PRN

## 2022-06-14 MED ORDER — ASPIRIN 325 MG PO TABS
650.0000 mg | ORAL_TABLET | Freq: Once | ORAL | Status: AC
  Administered 2022-06-14: 650 mg via ORAL
  Filled 2022-06-14: qty 2

## 2022-06-14 MED ORDER — IOHEXOL 350 MG/ML SOLN
75.0000 mL | Freq: Once | INTRAVENOUS | Status: AC | PRN
  Administered 2022-06-14: 75 mL via INTRAVENOUS

## 2022-06-14 MED ORDER — LORAZEPAM 2 MG/ML IJ SOLN
INTRAMUSCULAR | Status: AC
  Filled 2022-06-14: qty 1

## 2022-06-14 MED ORDER — STROKE: EARLY STAGES OF RECOVERY BOOK: Freq: Once | Status: AC

## 2022-06-14 MED ORDER — CLOPIDOGREL BISULFATE 75 MG PO TABS
300.0000 mg | ORAL_TABLET | Freq: Once | ORAL | Status: AC
  Administered 2022-06-14: 300 mg via ORAL
  Filled 2022-06-14: qty 4

## 2022-06-14 NOTE — Assessment & Plan Note (Addendum)
Blurry vision, query TIA - Neurology has been consulted and we appreciate further recommendations - Complete echo - Fasting lipid and A1c ordered - Permissive hypertension, prn antihypertensive medication for SBP greater than 220 to complete 48 hours coverage: Labetalol 5 mg IV every 2 hours as needed for SBP > 220, 24 hours ordered; hydralazine 5 mg IV every 6 hours as needed for SBP greater than 220 starting on 06/15/2022 at 1415, for an additional 24 hours - Neuro checks, q2h per neurology recommendatins - Per neurology recommendation, high intensity statin, atorvastatin 80 mg daily initiated, 3 doses ordered - N.p.o. pending swallow study - PT, OT, SLP - Fall precaution

## 2022-06-14 NOTE — ED Notes (Signed)
Pt passed swallow screen test at this time.

## 2022-06-14 NOTE — Progress Notes (Signed)
CODE STROKE- PHARMACY COMMUNICATION   Time CODE STROKE called/page received:1235  Time response to CODE STROKE was made (in person or via phone):  1242  Time Stroke Kit retrieved from [place] (only if needed): N/A  Name of Provider/Nurse contacted: Dr. Rory Percy  History reviewed. No pertinent past medical history. Prior to Admission medications   Medication Sig Start Date End Date Taking? Authorizing Provider  amLODipine (NORVASC) 10 MG tablet Take 1 tablet by mouth daily. 12/25/18   [provider]  ergocalciferol (VITAMIN D2) 1.25 MG (50000 UT) capsule  10/30/14   [provider]  levothyroxine (SYNTHROID) 50 MCG tablet TAKE 1 TABLET EVERY DAY ON EMPTY STOMACHWITH A GLASS OF WATER AT LEAST 30-60 MINBEFORE BREAKFAST 05/21/21   [provider]  lisinopril (ZESTRIL) 10 MG tablet Take 1 tablet by mouth daily. 10/01/15   [provider]    Alison Murray ,PharmD Clinical Pharmacist  06/14/2022  12:52 PM

## 2022-06-14 NOTE — Code Documentation (Signed)
Stroke Response Nurse Documentation Code Documentation  [name] is a 82 y.o. male arriving to [facility] via [facility] on 06/14/22 with no past medical history listed. On No antithrombotic. Code stroke was activated by ED.   Patient from home where he was LKW at 1000 and now complaining of blurry vision. Patient reports waking up in his normal state of health today and went to [place]. While driving on the way back home, he had acute onset of blurry vision. Patient went to the eye doctor where provider instructed patient to go to ED for eval.  Stroke team meets patient in CT after code stroke activation. Labs drawn in CT. Patient to CT with team. NIHSS 0 on exam, see documentation for details and code stroke times. The following imaging was completed:  CT Head, CTA, and MRI. Patient is not a candidate for IV Thrombolytic due to too mild to treat, per MD. Patient is not a candidate for IR due to no LVO seen on imaging, per MD. STAT MRI obtained.   Care Plan: q2h NIHSS and vital signs, swallow screen per order.    Bedside handoff with ED RN Ander Purpura.    Charise Carwin  Stroke Response RN

## 2022-06-14 NOTE — ED Triage Notes (Addendum)
Pt comes with c/o blurry vision. Pt states he woke up this am and was fine. Pt states he drove to get food and on way back his vision became blurry. Pt went to eye doctor and was sent here for evaluation.   Pt states this started at 10am. Pt states BP was elevated. Pt denies any dizziness.  Pt has some deficits on both sides per eye doc.

## 2022-06-14 NOTE — Consult Note (Signed)
Neurology Consultation  Reason for Consult: code stroke Referring Physician: Dr Archie Balboa  CC: visual changes  History is obtained from: Patient, chart  HPI: [name] is a 82 y.o. male past medical history of hypertension, hypothyroidism, presented to the emergency room from an optometrist office for concerns for visual field deficits in inferior visual fields in both eyes. Patient reports that he was in his usual state of health when he woke up this morning, went to have breakfast and as he was leaving that place, right around 10 AM he had a sudden onset of inability to see from his right eye.  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 Upon arrival to the ED, code stroke was activated since he was within the window for [facility] On my examination, NIH 0. CT head-unremarkable CT angio head and neck-age-indeterminate vert occlusion-see detailed report below. Taken for stat MRI given the fact that he had visual field deficits that could be localizable to a cortical lesion.  Preliminary review of the DWI images negative for acute process Likely TIA  Blood pressures on arrival systolic [id number].  No hypoglycemia noted.  LKW: 10 AM IV thrombolysis given?: no, symptoms resolved, NIH 0. EVT: No ELVO Premorbid modified Rankin scale (mRS): 0   ROS: Full ROS was performed and is negative except as noted in the HPI.   Past medical history Hypertension Hypothyroidism  No family history on file.  Social History:   reports that he has never smoked. He has never been exposed to tobacco smoke. He has never used smokeless tobacco. No history on file for alcohol use and drug use.  Medications  Current Facility-Administered Medications:    LORazepam (ATIVAN) 2 MG/ML injection, , , ,    sodium chloride  flush (NS) 0.9 % injection 3 mL, 3 mL, Intravenous, Once, Nance Pear, MD  Current Outpatient Medications:    amLODipine (NORVASC) 10 MG tablet, Take 1 tablet by mouth daily., Disp: , Rfl:    ergocalciferol (VITAMIN D2) 1.25 MG (50000 UT) capsule, , Disp: , Rfl:    levothyroxine (SYNTHROID) 50 MCG tablet, TAKE 1 TABLET EVERY DAY ON EMPTY STOMACHWITH A GLASS OF WATER AT LEAST 30-60 MINBEFORE BREAKFAST, Disp: , Rfl:    lisinopril (ZESTRIL) 10 MG tablet, Take 1 tablet by mouth daily., Disp: , Rfl:   Exam: Current vital signs: BP (!) 182/72 Comment: per triage RN Vital signs in last 24 hours: BP: (182)/(72) 182/72 (02/20 1238)  General: Awake, alert in NAD HEENT: - Normocephalic and atraumatic, dry mm, no LN++, no Thyromegally LUNGS - Clear to auscultation bilaterally with no wheezes CV - S1S2 RRR, no m/r/g, equal pulses bilaterally. ABDOMEN - Soft, nontender, nondistended with normoactive BS Ext: warm, well perfused, intact peripheral pulses, no edema  NEURO:  Mental Status: AA&Ox3  Language: speech is nondysarthric.  Naming, repetition, fluency, and comprehension intact. Cranial Nerves: PERRL EOMI, visual fields full, no facial asymmetry, facial sensation intact, hearing i severely reduced bilaterally, tongue/uvula/soft palate midline, normal sternocleidomastoid and trapezius muscle strength. No evidence of tongue atrophy or fibrillations Motor: 5/5 in all fours with no drift Tone: is normal and bulk is normal Sensation- Intact to light touch bilaterally Coordination: FTN intact bilaterally, no ataxia in BLE. Gait- deferred  NIHSS-0  Labs I have reviewed labs in epic and the results pertinent to  this consultation are:  CBC    Component Value Date/Time   WBC 7.8 06/14/2022 1251   RBC 5.57 06/14/2022 1251   HGB 17.0 06/14/2022 1251   HGB 14.5 09/15/2013 0607   HCT 51.1 06/14/2022 1251   HCT 43.8 09/15/2013 0607   PLT 193 06/14/2022 1251   PLT 156 09/15/2013 0607   MCV  91.7 06/14/2022 1251   MCV 96 09/15/2013 0607   MCH 30.5 06/14/2022 1251   MCHC 33.3 06/14/2022 1251   RDW 13.4 06/14/2022 1251   RDW 14.7 (H) 09/15/2013 0607   LYMPHSABS 3.2 06/14/2022 1251   LYMPHSABS 1.4 09/15/2013 0607   MONOABS 0.5 06/14/2022 1251   MONOABS 0.6 09/15/2013 0607   EOSABS 0.1 06/14/2022 1251   EOSABS 0.1 09/15/2013 0607   BASOSABS 0.1 06/14/2022 1251   BASOSABS 0.0 09/15/2013 0607   CMP     Component Value Date/Time   NA 138 09/15/2013 0607   K 3.8 09/15/2013 0607   CL 109 (H) 09/15/2013 0607   CO2 23 09/15/2013 0607   GLUCOSE 81 09/15/2013 0607   BUN 13 09/15/2013 0607   CREATININE 1.26 09/15/2013 0607   CALCIUM 8.3 (L) 09/15/2013 0607   PROT 7.2 09/15/2013 0607   ALBUMIN 2.9 (L) 09/15/2013 0607   AST 22 09/15/2013 0607   ALT 13 09/15/2013 0607   ALKPHOS 43 (L) 09/15/2013 0607   BILITOT 0.7 09/15/2013 0607   GFRNONAA 56 (L) 09/15/2013 0607   GFRAA >60 09/15/2013 0607    Lipid Panel     Component Value Date/Time   TRIG 170 09/13/2013 0626   Imaging I have reviewed the images obtained: CT-head no acute changes CT angio head and neck: Age-indeterminate right vertebral artery occlusion.  No emergent LVO. MRI examination of the brain-formal read pending.  Preliminary review of DWI imaging negative for acute stroke.  Assessment:  82 year old man with a history of hypertension and hypothyroidism presents to the emergency room for sudden onset of visual changes, which she initially described as inability to see from his right eye.  He went to see an optometrist, who did a full dilated exam and found on confrontation and a formal visual field testing that he has a homonymous inferior quadrantanopsia-note being faxed and pending. Given that his symptoms had resolved but the fact that he had symptoms that might be localized to the occipital cortex, he was taken for stat MRI of the brain-which on preliminary review was negative for an acute stroke. His  symptoms are consistent with TIA localizing to the occipital lobe. I would recommend admission for further stroke risk factor workup  Impression: Evaluate for TIA  Recommendations: Admit to hospitalist Frequent neurochecks Telemetry Load with aspirin 650 mg x 1 and Plavix 300 mg x 1. From tomorrow-aspirin 81+ Plavix 75 High intensity statin Check 2D echo Check lipid panel and A1c PT OT speech therapy Permissive hypertension-allow for blood pressures to be as high as systolic [id number] and treat only on a as needed basis if higher than that for the next 48 to 72 hours.  After that, start normalizing the blood pressure with goal blood pressure [place]-term of normotension I will follow with you Plan was discussed with Dr. Archie Balboa, patient and family.  -- Amie Portland, MD Neurologist [facility] Pager: [phone]

## 2022-06-14 NOTE — Progress Notes (Signed)
Since symptoms have resolved and MRI is negative, if he has any further symptoms, dose should be considered as new symptoms and a code stroke should be activated if there is a concern for new strokelike symptoms. For now can do every 2 hours neurochecks. This was relayed to the bedside RN as well as the care team in the ER.   -- Amie Portland, MD Neurologist [facility] Pager: [phone]

## 2022-06-14 NOTE — Assessment & Plan Note (Signed)
-   Asymptomatic at this time - Continue outpatient and monitoring follow-up as appropriate

## 2022-06-14 NOTE — H&P (Signed)
History and Physical   [name] [id number] DOB: 28-May-1940 DOA: 06/14/2022  PCP: Baxter Hire, MD  Outpatient Specialists: Dr. Lucky Cowboy, vascular surgery service Patient coming from: Eye doctor office  I have personally briefly reviewed patient's old medical records in [place].  Chief Concern: Blurry vision  HPI: Mr. 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  Patient woke up in the morning and was feeling fine, he went to get breakfast at this could feel and after breakfast he developed blurry vision bilaterally.  Per nursing note, last known normal was 10 AM on day of admission.  Initial vitals in the ED showed temperature of 97, respiration rate of 15, heart rate of 63, blood pressure 182/72, SpO2 of 95% on room air.  Serum sodium is 137, potassium 3.8, chloride 103, bicarb 26, BUN of 19, serum creatinine of 1.33, GFR greater than 53, nonfasting blood glucose 105, WBC 7.8, hemoglobin 17, platelets of 193.  EtOH level was less than 10.  Code stroke was called, neurology evaluated patient at bedside and a consult was placed.  CT head code stroke without contrast: Was read as no acute CT finding.  Mild age related volume loss.  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  Calcified plaque at the carotid bifurcations, left worse than right, without hemodynamically significant stenosis.  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  Large nasal septal perforation.  MRI of the brain without contrast: No evidence of acute infarct.  Mild chronic small vessel ischemic changes within the cerebral white  matter.  Mild generalized cerebral atrophy.  ED treatment: Aspirin 650 mg p.o. one-time dose, Plavix 300 mg p.o. one-time dose, Ativan 2 mg IV. ------------------------------------- At bedside, he is able to tell me his name, age, current calendar 2024, current location of [facility].   He reports he could not see out of his right eye. He could see a little bit, more like frost out of his right eye. This was approximately 9:30a/10a, after eating breakfast at [place].   This lasted approximately 2-2.5 hours. He improved after the eye doctor dilated both his eyes. After conclusion of the eye exam, the eye doctor thought he may have had a stroke, therefore he was sent go to the ED, and he drove himself and his wife to the ED.   He denies chest pain, abdominal pain, dysuria, diarrhea, fever, shortness of breath.  Social history: He lives at home with his wife. He denies tobacco use, etoh, and recreational drug use. He is retired and formerly was a Armed forces operational officer, Physicist, medical.   Family history: brother had a stroke in his 3s, sister in her mid 19s.   ROS: Constitutional: no weight change, no fever ENT/Mouth: no sore throat, no rhinorrhea Eyes: no eye pain, + vision changes, + right sided eye blurriness Cardiovascular: no chest pain, no dyspnea,  no edema, no palpitations Respiratory: no cough, no sputum, no wheezing Gastrointestinal: no nausea, no vomiting, no diarrhea, no constipation Genitourinary: no urinary incontinence, no dysuria, no hematuria Musculoskeletal: no arthralgias, no myalgias Skin: no skin lesions, no pruritus, Neuro: + weakness, no loss of consciousness, no syncope Psych: no anxiety, no depression, no decrease  appetite Heme/Lymph: no bruising, no bleeding  ED Course: Discussed with emergency medicine provider, patient requiring hospitalization for chief concerns of TIA.  Assessment/Plan  Principal Problem:   TIA (transient ischemic attack) Active  Problems:   AAA (abdominal aortic aneurysm) without rupture (HCC)   Essential hypertension   Hypothyroidism   Blurry vision, right eye   Assessment and Plan:  * TIA (transient ischemic attack) Blurry vision, query TIA - Neurology has been consulted and we appreciate further recommendations - Complete echo - Fasting lipid and A1c ordered - Permissive hypertension, prn antihypertensive medication for SBP greater than 220 to complete 48 hours coverage: Labetalol 5 mg IV every 2 hours as needed for SBP > 220, 24 hours ordered; hydralazine 5 mg IV every 6 hours as needed for SBP greater than 220 starting on 06/15/2022 at 1415, for an additional 24 hours - Neuro checks, q2h per neurology recommendatins - Per neurology recommendation, high intensity statin, atorvastatin 80 mg daily initiated, 3 doses ordered - N.p.o. pending swallow study - PT, OT, SLP - Fall precaution   Blurry vision, right eye - This has resolved - Query TIA, treat per above  Hypothyroidism - Levothyroxine 50 mcg daily before breakfast resumed  Essential hypertension - Home antihypertensive medication amlodipine 10 mg daily, lisinopril 10 mg daily not resumed on admission; holding these for permissive hypertension - Labetalol 5 mg IV every 2 hours as needed for SBP greater than 220, for 24 hours - Hydralazine 5 mg IV every 6 hours as needed for SBP greater than 220 starting on 06/15/2022, after 1415 for 24 hours  AAA (abdominal aortic aneurysm) without rupture (HCC) - Asymptomatic at this time - Continue outpatient and monitoring follow-up as appropriate  Chart reviewed.   DVT prophylaxis: Enoxaparin 40 mg subcutaneous nightly Code Status: full code Diet: heart healthy Family Communication: Crista Curb, spouse, at bedside and daughter Malachy Mood were updated with patient's permission Disposition Plan: Pending clinical course Consults called: Neurology Admission status: telemetry medical  Past Medical History:   Diagnosis Date   Hypertension    Hypothyroid    History reviewed. No pertinent surgical history.  Social History:  reports that he has never smoked. He has never been exposed to tobacco smoke. He has never used smokeless tobacco. He reports that he does not use drugs. No history on file for alcohol use.  Allergies  Allergen Reactions   Cephalexin Swelling   Sulfa Antibiotics Swelling   Family History  Problem Relation Age of Onset   Stroke Sister    Stroke Brother    Family history: Family history reviewed and not pertinent.  Prior to Admission medications   Medication Sig Start Date End Date Taking? Authorizing Provider  amLODipine (NORVASC) 10 MG tablet Take 1 tablet by mouth daily. 12/25/18  Yes [provider]  levothyroxine (SYNTHROID) 50 MCG tablet TAKE 1 TABLET EVERY DAY ON EMPTY STOMACHWITH A GLASS OF WATER AT LEAST 30-60 MINBEFORE BREAKFAST 05/21/21  Yes [provider]  lisinopril (ZESTRIL) 10 MG tablet Take 1 tablet by mouth daily. 10/01/15  Yes [provider]  ergocalciferol (VITAMIN D2) 1.25 MG (50000 UT) capsule  10/30/14   [provider]   Physical Exam: Vitals:   06/14/22 1330 06/14/22 1351 06/14/22 1400 06/14/22 1552  BP:  (!) 143/75  126/68  Pulse:  61  (!) 57  Resp:  15  18  Temp:  (!) 97 F (36.1 C)  98 F (36.7 C)  TempSrc:  Temporal    SpO2:  97% 95%  98%  Weight: 79.7 kg     Height:   5' 4"$  (1.626 m)    Constitutional: appears age, frail, NAD, calm, comfortable Eyes: PERRL, lids and conjunctivae normal ENMT: Mucous membranes are moist. Posterior pharynx clear of any exudate or lesions. Age-appropriate dentition.  Bilateral hearing loss Neck: normal, supple, no masses, no thyromegaly Respiratory: clear to auscultation bilaterally, no wheezing, no crackles. Normal respiratory effort. No accessory muscle use.  Cardiovascular: Regular rate and rhythm, no murmurs / rubs / gallops. No extremity edema. 2+ pedal pulses. No  carotid bruits.  Abdomen: no tenderness, no masses palpated, no hepatosplenomegaly. Bowel sounds positive.  Musculoskeletal: no clubbing / cyanosis. No joint deformity upper and lower extremities. Good ROM, no contractures, no atrophy. Normal muscle tone.  Skin: no rashes, lesions, ulcers. No induration Neurologic: Sensation intact. Strength 5/5 in all 4.  Psychiatric: Normal judgment and insight. Alert and oriented x 3. Normal mood.   EKG: Ordered per EDP, pending completion  Chest x-ray on Admission: Not indicated at this time  MR BRAIN WO CONTRAST  Result Date: 06/14/2022 CLINICAL DATA:  Provided history: Stroke, follow-up. Blurry vision. EXAM: MRI HEAD WITHOUT CONTRAST TECHNIQUE: Multiplanar, multiecho pulse sequences of the brain and surrounding structures were obtained without intravenous contrast. COMPARISON:  Non-contrast head CT and CT angiogram head/neck 06/14/2022. FINDINGS: A limited stroke protocol non-contrast brain MRI was performed at the ordering provider's request. The following sequences were acquired: Axial and coronal diffusion-weighted, axial T2 FLAIR and axial susceptibility-weighted. Brain: Mild generalized cerebral atrophy. Multifocal T2 FLAIR hyperintense signal abnormality within the cerebral white matter, nonspecific but compatible with mild chronic small vessel ischemic disease. There is no acute infarct. No evidence of an intracranial mass. No chronic intracranial blood products. No extra-axial fluid collection. No midline shift. Vascular: Poorly assessed on the acquired sequences. Skull and upper cervical spine: No focal suspicious marrow lesion identified on the acquired sequences. Sinuses/Orbits: No acute orbital finding. No significant paranasal sinus disease. Nasal septal defect. IMPRESSION: 1. A non-contrast stroke protocol brain MRI was performed. 2. No evidence of an acute infarct. 3. Mild chronic small vessel ischemic changes within the cerebral white matter. 4.  Mild generalized cerebral atrophy. Electronically Signed   By: Kellie Simmering D.O.   On: 06/14/2022 13:43   CT ANGIO HEAD NECK W WO CM (CODE STROKE)  Result Date: 06/14/2022 CLINICAL DATA:  Blurry vision and left-sided weakness EXAM: CT ANGIOGRAPHY HEAD AND NECK TECHNIQUE: Multidetector CT imaging of the head and neck was performed using the standard protocol during bolus administration of intravenous contrast. Multiplanar CT image reconstructions and MIPs were obtained to evaluate the vascular anatomy. Carotid stenosis measurements (when applicable) are obtained utilizing NASCET criteria, using the distal internal carotid diameter as the denominator. RADIATION DOSE REDUCTION: This exam was performed according to the departmental dose-optimization program which includes automated exposure control, adjustment of the mA and/or kV according to patient size and/or use of iterative reconstruction technique. CONTRAST:  43m OMNIPAQUE IOHEXOL 350 MG/ML SOLN COMPARISON:  Same-day head CT FINDINGS: CTA NECK FINDINGS Aortic arch: There is a mixed plaque in the imaged aortic arch. The origins of the major branch vessels are patent. The subclavian arteries are patent to the level imaged. Right carotid system: The right common, internal, and external carotid arteries are patent, with mild plaque at the bifurcation but no hemodynamically significant stenosis or occlusion. There is no dissection or aneurysm. Left carotid system: The left common, internal, and external carotid arteries are patent,  with mild noncalcified plaque in the common carotid artery and calcified plaque in the proximal internal carotid artery but no hemodynamically significant stenosis or occlusion. There is no dissection or aneurysm. 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 The vertebral artery remains patent but diminutive throughout the remainder of its  course. The dominant left vertebral artery is patent, without hemodynamically significant stenosis or occlusion. There is no raised dissection flap or aneurysm. Skeleton: Postsurgical changes reflecting prior fusion at C5-C6 and C6-C7 with ACDF hardware in place at C5-C6 are noted. There is no evidence of hardware related complication. There is no acute osseous abnormality or suspicious osseous lesion. There is no visible canal hematoma. Other neck: There is a large nasal septal perforation. The soft tissues of the neck are unremarkable Upper chest: There is emphysema in the lung apices mild apical scarring. Review of the MIP images confirms the above findings CTA HEAD FINDINGS Anterior circulation: There is mild calcified plaque in the intracranial ICAs without significant stenosis. The bilateral MCAs are patent, without proximal stenosis or occlusion. The bilateral ACAs are patent, without proximal stenosis or occlusion. The anterior communicating artery is not definitely seen. There is a 2 mm inferiorly directed outpouching arising from the right ICA terminus which could reflect a small aneurysm or infundibulum (10-112). Posterior circulation: The bilateral V4 segments are patent the right vertebral artery is diminutive in caliber proximal to the PICA origin. There is no significant stenosis of the dominant left vertebral artery. The basilar artery is patent. The major cerebellar arteries appear patent. The bilateral PCAs are patent, without proximal stenosis or occlusion. Posterior communicating arteries are not identified. There is no aneurysm or AVM. Venous sinuses: Suboptimally evaluated due to bolus timing. No definite evidence of venous thrombosis. Anatomic variants: None. Review of the MIP images confirms the above findings IMPRESSION: 1. 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 2. Otherwise  patent vasculature of the head and neck with no other occlusion. 3. Calcified plaque at the carotid bifurcations, left worse than right, without hemodynamically significant stenosis. 4. No significant stenosis in the intracranial vasculature. 5. 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 6. Large nasal septal perforation. Impression point #1 was discussed with Dr. Rory Percy at 1:07 p.m. Electronically Signed   By: Valetta Mole M.D.   On: 06/14/2022 13:14   CT HEAD CODE STROKE WO CONTRAST  Result Date: 06/14/2022 CLINICAL DATA:  Code stroke. Acute onset of blurred vision and left-sided weakness EXAM: CT HEAD WITHOUT CONTRAST TECHNIQUE: Contiguous axial images were obtained from the base of the skull through the vertex without intravenous contrast. RADIATION DOSE REDUCTION: This exam was performed according to the departmental dose-optimization program which includes automated exposure control, adjustment of the mA and/or kV according to patient size and/or use of iterative reconstruction technique. COMPARISON:  None Available. FINDINGS: Brain: Mild age related volume loss. No sign of acute infarction, mass lesion, hemorrhage, hydrocephalus or extra-axial collection. Vascular: No acute vascular finding. Skull: Normal Sinuses/Orbits: Clear/normal Other: None ASPECTS ([place] Stroke Program Early CT Score) - Ganglionic level infarction (caudate, lentiform nuclei, internal capsule, insula, M1-M3 cortex): 7 - Supraganglionic infarction (M4-M6 cortex): 3 Total score (0-10 with 10 being normal): 10 IMPRESSION: 1. No acute CT finding. Mild age related volume loss. 2. Aspects is 10. These results were  communicated to Dr. Rory Percy at 12:46 pm on 06/14/2022 by text page via the [facility] messaging system. Electronically Signed   By: Nelson Chimes M.D.   On: 06/14/2022 12:48    Labs on Admission: I have personally reviewed following labs  CBC: Recent Labs  Lab 06/14/22 1251  WBC 7.8   NEUTROABS 4.0  HGB 17.0  HCT 51.1  MCV 91.7  PLT 0000000   Basic Metabolic Panel: Recent Labs  Lab 06/14/22 1251  NA 137  K 3.8  CL 103  CO2 26  GLUCOSE 105*  BUN 19  CREATININE 1.33*  CALCIUM 9.1   GFR: Estimated Creatinine Clearance: 40.8 mL/min (A) (by C-G formula based on SCr of 1.33 mg/dL (H)).  Liver Function Tests: Recent Labs  Lab 06/14/22 1251  AST 36  ALT 34  ALKPHOS 58  BILITOT 0.8  PROT 7.8  ALBUMIN 4.5   Coagulation Profile: Recent Labs  Lab 06/14/22 1251  INR 1.0   CBG: Recent Labs  Lab 06/14/22 1233  GLUCAP 118*   Urine analysis:    Component Value Date/Time   COLORURINE Yellow 04/19/2013 1934   APPEARANCEUR Clear 04/19/2013 1934   LABSPEC 1.018 04/19/2013 1934   PHURINE 5.0 04/19/2013 1934   GLUCOSEU Negative 04/19/2013 1934   HGBUR Negative 04/19/2013 1934   BILIRUBINUR Negative 04/19/2013 1934   KETONESUR Negative 04/19/2013 1934   PROTEINUR 30 mg/dL 04/19/2013 1934   NITRITE Negative 04/19/2013 1934   LEUKOCYTESUR Negative 04/19/2013 1934   This document was prepared using Dragon Voice Recognition software and may include unintentional dictation errors.  Dr. Tobie Poet [facility]  If 7PM-7AM, please contact overnight-coverage provider If 7AM-7PM, please contact day coverage provider [email or web address]  06/14/2022, 7:40 PM

## 2022-06-14 NOTE — Assessment & Plan Note (Signed)
-   Levothyroxine 50 mcg daily before breakfast resumed

## 2022-06-14 NOTE — ED Provider Notes (Signed)
[facility] Provider Note    Event Date/Time   First MD Initiated Contact with Patient 06/14/22 1240     (approximate)   History   Blurred Vision   HPI  [name] is a 82 y.o. male who presents to the emergency department from eye doctors because of concerns for change in vision.  The patient's change in vision happened this morning.  Daughter was concerned for possible stroke so code stroke was initiated.  At the time my exam patient is feeling better.  He denies any other acute neurologic deficits.     Physical Exam   Triage Vital Signs: ED Triage Vitals  Enc Vitals Group     BP 06/14/22 1238 (!) 182/72     Pulse Rate 06/14/22 1351 61     Resp 06/14/22 1351 15     Temp 06/14/22 1351 (!) 97 F (36.1 C)     Temp Source 06/14/22 1351 Temporal     SpO2 06/14/22 1330 97 %     Weight 06/14/22 1330 175 lb 12.8 oz (79.7 kg)     Height --      Head Circumference --      Peak Flow --      Pain Score 06/14/22 1231 0     Pain Loc --      Pain Edu? --      Excl. in [place]? --     Most recent vital signs: Vitals:   06/14/22 1330 06/14/22 1351  BP:  (!) 143/75  Pulse:  61  Resp:  15  Temp:  (!) 97 F (36.1 C)  SpO2: 97% 95%   General: Awake, alert, oriented. CV:  Good peripheral perfusion. Regular rate and rhythm. Resp:  Normal effort. Lungs clear. Abd:  No distention.     ED Results / Procedures / Treatments   Labs (all labs ordered are listed, but only abnormal results are displayed) Labs Reviewed  COMPREHENSIVE METABOLIC PANEL - Abnormal; Notable for the following components:      Result Value   Glucose, Bld 105 (*)    Creatinine, Ser 1.33 (*)    GFR, Estimated 53 (*)    All other components within normal limits  CBG MONITORING, ED - Abnormal; Notable for the following components:   Glucose-Capillary 118 (*)    All other components within normal limits  PROTIME-INR  APTT  CBC  DIFFERENTIAL  ETHANOL  I-STAT CREATININE, ED     RADIOLOGY I independently interpreted and visualized the CT head. My interpretation: No bleed Radiology interpretation:  IMPRESSION:  1. No acute CT finding. Mild age related volume loss.  2. Aspects is 10.     PROCEDURES:  Critical Care performed: No  Procedures   MEDICATIONS ORDERED IN ED: Medications  LORazepam (ATIVAN) 2 MG/ML injection (has no administration in time range)  sodium chloride flush (NS) 0.9 % injection 3 mL (3 mLs Intravenous Given 06/14/22 1346)  iohexol (OMNIPAQUE) 350 MG/ML injection 75 mL (75 mLs Intravenous Contrast Given 06/14/22 1250)  aspirin tablet 650 mg (650 mg Oral Given 06/14/22 1344)  clopidogrel (PLAVIX) tablet 300 mg (300 mg Oral Given 06/14/22 1344)     IMPRESSION / MDM / ASSESSMENT AND PLAN / ED COURSE  I reviewed the triage vital signs and the nursing notes.                              Differential diagnosis includes,  but is not limited to, stroke, TIA, retinal detachment  Patient's presentation is most consistent with acute presentation with potential threat to life or bodily function.   Patient presented from the eye doctor's office this morning because of concerns for vision change.  Patient was called a code stroke.  Dr. Rory Percy with neurology evaluated the patient.  Did not feel TNK was warranted at this time.  CT scan without abnormal findings.  Discussed with Dr. Tobie Poet with hospitalist service who will plan on admission.   FINAL CLINICAL IMPRESSION(S) / ED DIAGNOSES   Final diagnoses:  Vision changes    Note:  This document was prepared using Dragon voice recognition software and may include unintentional dictation errors.    Nance Pear, MD 06/14/22 [phone]

## 2022-06-14 NOTE — Assessment & Plan Note (Deleted)
-   Query TIA, treat per above

## 2022-06-14 NOTE — Hospital Course (Addendum)
Mr. 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  Patient woke up in the morning and was feeling fine, he went to get breakfast at this could feel and after breakfast he developed blurry vision bilaterally.  Per nursing note, last known normal was 10 AM on day of admission.  Initial vitals in the ED showed temperature of 97, respiration rate of 15, heart rate of 63, blood pressure 182/72, SpO2 of 95% on room air.  Serum sodium is 137, potassium 3.8, chloride 103, bicarb 26, BUN of 19, serum creatinine of 1.33, GFR greater than 53, nonfasting blood glucose 105, WBC 7.8, hemoglobin 17, platelets of 193.  EtOH level was less than 10.  Code stroke was called, neurology evaluated patient at bedside and a consult was placed.  CT head code stroke without contrast: Was read as no acute CT finding.  Mild age related volume loss.  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  Calcified plaque at the carotid bifurcations, left worse than right, without hemodynamically significant stenosis.  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  Large nasal septal perforation.  MRI of the brain without contrast: No evidence of acute infarct.  Mild chronic small vessel ischemic changes within the cerebral white matter.  Mild generalized cerebral atrophy.  ED treatment: Aspirin 650 mg p.o. one-time dose, Plavix 300 mg p.o. one-time dose, Ativan 2 mg IV.

## 2022-06-14 NOTE — ED Notes (Signed)
Code  stroke  called  to  [facility]

## 2022-06-14 NOTE — ED Notes (Signed)
Neurology came into the room to speak with family. The neurologist did not see any sign of a stroke on the CT scan or MRI.   Neurologist wants neuro checks q2hr.   If new s/s appear, new stroke alert needs to be activated.

## 2022-06-14 NOTE — Assessment & Plan Note (Signed)
-   This has resolved - Query TIA, treat per above

## 2022-06-14 NOTE — Assessment & Plan Note (Signed)
-   Home antihypertensive medication amlodipine 10 mg daily, lisinopril 10 mg daily not resumed on admission; holding these for permissive hypertension - Labetalol 5 mg IV every 2 hours as needed for SBP greater than 220, for 24 hours - Hydralazine 5 mg IV every 6 hours as needed for SBP greater than 220 starting on 06/15/2022, after 1415 for 24 hours

## 2022-06-14 NOTE — ED Notes (Signed)
his pupils are sluggish because the pt was at the eye doctors before coming to ED. Eye doctor placed dilation drops in the patients eyes.

## 2022-06-15 DIAGNOSIS — G459 Transient cerebral ischemic attack, unspecified: Secondary | ICD-10-CM | POA: Diagnosis not present

## 2022-06-15 LAB — ECHOCARDIOGRAM COMPLETE
AR max vel: 1.89 cm2
AV Area VTI: 2.05 cm2
AV Area mean vel: 1.73 cm2
AV Mean grad: 5.6 mmHg
AV Peak grad: 10.9 mmHg
Ao pk vel: 1.65 m/s
Area-P 1/2: 3.6 cm2
Height: 64 in
S' Lateral: 2.6 cm
Weight: 2812.8 oz

## 2022-06-15 LAB — HEMOGLOBIN A1C
Hgb A1c MFr Bld: 4.7 % — ABNORMAL LOW (ref 4.8–5.6)
Mean Plasma Glucose: 88.19 mg/dL

## 2022-06-15 LAB — LIPID PANEL
Cholesterol: 190 mg/dL (ref 0–200)
HDL: 30 mg/dL — ABNORMAL LOW (ref >40)
LDL Cholesterol: 119 mg/dL — ABNORMAL HIGH (ref 0–99)
Total CHOL/HDL Ratio: 6.3 RATIO
Triglycerides: 205 mg/dL — ABNORMAL HIGH (ref <150)
VLDL: 41 mg/dL — ABNORMAL HIGH (ref 0–40)

## 2022-06-15 MED ORDER — CLOPIDOGREL BISULFATE 75 MG PO TABS: 75.0000 mg | ORAL_TABLET | Freq: Every day | ORAL | 0 refills | Status: AC

## 2022-06-15 MED ORDER — ATORVASTATIN CALCIUM 40 MG PO TABS: 40.0000 mg | ORAL_TABLET | Freq: Every day | ORAL | 0 refills | Status: AC

## 2022-06-15 MED ORDER — AMLODIPINE BESYLATE 10 MG PO TABS: 10.0000 mg | ORAL_TABLET | Freq: Every day | ORAL | 1 refills | Status: AC

## 2022-06-15 MED ORDER — CLOPIDOGREL BISULFATE 75 MG PO TABS
75.0000 mg | ORAL_TABLET | Freq: Every day | ORAL | Status: DC
  Administered 2022-06-15: 75 mg via ORAL
  Filled 2022-06-15: qty 1

## 2022-06-15 MED ORDER — ASPIRIN 81 MG PO TBEC
81.0000 mg | DELAYED_RELEASE_TABLET | Freq: Every day | ORAL | Status: DC
  Administered 2022-06-15: 81 mg via ORAL
  Filled 2022-06-15: qty 1

## 2022-06-15 MED ORDER — ATORVASTATIN CALCIUM 20 MG PO TABS: 80.0000 mg | ORAL_TABLET | Freq: Every day | ORAL | Status: DC

## 2022-06-15 MED ORDER — ASPIRIN 81 MG PO TBEC: 81.0000 mg | DELAYED_RELEASE_TABLET | Freq: Every day | ORAL | 12 refills | Status: AC

## 2022-06-15 MED ORDER — LISINOPRIL 10 MG PO TABS: 10.0000 mg | ORAL_TABLET | Freq: Every day | ORAL | 0 refills | Status: AC

## 2022-06-15 NOTE — Evaluation (Signed)
Occupational Therapy Evaluation Patient Details Name: [name] MRN: [id number] DOB: 1941/02/14 Today's Date: 06/15/2022   History of Present Illness Pt is an 82 year old male presenting to the ED with chief concerns of blurry vision; MRI shows No evidence of acute infarct.  Mild chronic small vessel ischemic changes within the cerebral white matter.  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 Pt is alert and oriented x4. PTA pt is indep in ADL/IADL, amb with no AD, assists his wife with IADL as needed. Pt reports he feels he has returned to baseline, visual symptoms that brought him to the ED have resolved. Pt appears to be performing mobility/ADL status at or close to baseline. No further OT needs identified, OT will sign off at this time. Pt and family educated on re-consult if there is a change in functional status.      Recommendations for follow up therapy are one component of a multi-disciplinary discharge planning process, led by the attending physician.  Recommendations may be updated based on patient status, additional functional criteria and insurance authorization.   Follow Up Recommendations  No OT follow up     Assistance Recommended at Discharge PRN  Patient can return home with the following Assistance with cooking/housework    Functional Status Assessment  Patient has had a recent decline in their functional status and demonstrates the ability to make significant improvements in function in a reasonable and predictable amount of time.  Equipment Recommendations  None recommended by OT    Recommendations for Other Services       Precautions / Restrictions Precautions Precautions: None Restrictions Weight Bearing Restrictions: No      Mobility Bed Mobility Overal bed  mobility: Independent                  Transfers Overall transfer level: Modified independent Equipment used: None                      Balance Overall balance assessment: Needs assistance Sitting-balance support: Feet supported Sitting balance-Leahy Scale: Normal     Standing balance support: No upper extremity supported Standing balance-Leahy Scale: Good                             ADL either performed or assessed with clinical judgement   ADL Overall ADL's : At baseline                                       General ADL Comments: indep for toilet transfers, indep for grooming at sink level,MOD I- indep for funcitonal mobility community distances in the hallway     Vision Patient Visual Report: No change from baseline Additional Comments: pt reports symptoms that brought him to the ED have resolved     Perception     Praxis      Pertinent Vitals/Pain Pain Assessment Pain Assessment: No/denies pain     Hand Dominance     Extremity/Trunk Assessment Upper Extremity Assessment Upper Extremity Assessment: Overall WFL for tasks assessed   Lower Extremity Assessment Lower Extremity Assessment: Overall WFL for tasks assessed       Communication Communication Communication: No difficulties   Cognition  Arousal/Alertness: Awake/alert Behavior During Therapy: WFL for tasks assessed/performed Overall Cognitive Status: Within Functional Limits for tasks assessed                                       General Comments       Exercises Other Exercises Other Exercises: edu re: role of OT, role of rehab, discharge recommendations, home safety   Shoulder Instructions      Home Living Family/patient expects to be discharged to:: Private residence Living Arrangements: Spouse/significant other Available Help at Discharge: Family Type of Home: House Home Access: Level entry     Home Layout: One level      Bathroom Shower/Tub: Tub/shower unit;Walk-in shower   Bathroom Toilet: Standard     Home Equipment: None   Additional Comments: pt does not use any equipment      Prior Functioning/Environment Prior Level of Function : Independent/Modified Independent             Mobility Comments: amb no AD, no fall history ADLs Comments: MOD I-I with ADL/IADL        OT Problem List:        OT Treatment/Interventions:      OT Goals(Current goals can be found in the care plan section)    OT Frequency:      Co-evaluation              AM-PAC OT "6 Clicks" Daily Activity     Outcome Measure Help from another person eating meals?: None Help from another person taking care of personal grooming?: None Help from another person toileting, which includes using toliet, bedpan, or urinal?: None Help from another person bathing (including washing, rinsing, drying)?: None Help from another person to put on and taking off regular upper body clothing?: None Help from another person to put on and taking off regular lower body clothing?: None 6 Click Score: 24   End of Session Nurse Communication: Mobility status  Activity Tolerance: Patient tolerated treatment well Patient left: in bed;with call bell/phone within reach;with family/visitor present                   Time: 0903-0920 OT Time Calculation (min): 17 min Charges:  OT General Charges $OT Visit: 1 Visit OT Evaluation $OT Eval Low Complexity: 1 Low  Shanon Payor, OTD OTR/L  06/15/22, 10:39 AM

## 2022-06-15 NOTE — TOC Initial Note (Signed)
Transition of Care [facility]) - Initial/Assessment Note    Patient Details  Name: [name] MRN: [id number] Date of Birth: 19-Nov-1940  [facility] of Care [facility]) CM/SW Contact:    Laurena Slimmer, RN Phone Number: 06/15/2022, 10:44 AM  Clinical Narrative:                  Transition of Care [facility]) Screening Note   Patient Details  Name: [name] Date of Birth: [birth year]   [facility] of [facility]) CM/SW Contact:    Laurena Slimmer, RN Phone Number: 06/15/2022, 10:44 AM    Transition of Care Department [facility]) has reviewed patient and no TOC needs have been identified at this time. We will continue to monitor patient advancement through interdisciplinary progression rounds. If new patient transition needs arise, please place a TOC consult.          Patient Goals and CMS Choice            Expected Discharge Plan and Services         Expected Discharge Date: 06/15/22                                    Prior Living Arrangements/Services                       Activities of Daily Living Home Assistive Devices/Equipment: Eyeglasses ADL Screening (condition at time of admission) Patient's cognitive ability adequate to safely complete daily activities?: Yes Is the patient deaf or have difficulty hearing?: No Does the patient have difficulty seeing, even when wearing glasses/contacts?: Yes Does the patient have difficulty concentrating, remembering, or making decisions?: No Patient able to express need for assistance with ADLs?: Yes Does the patient have difficulty dressing or bathing?: No Independently performs ADLs?: Yes (appropriate for developmental age) Does the patient have difficulty walking or climbing stairs?: No Weakness of Legs: None Weakness of Arms/Hands: None  Permission Sought/Granted                  Emotional Assessment              Admission diagnosis:  TIA (transient ischemic attack) [G45.9] Vision changes  [H53.9] Patient Active Problem List   Diagnosis Date Noted   TIA (transient ischemic attack) 06/14/2022   Hypothyroidism 06/14/2022   Blurry vision, right eye 06/14/2022   AAA (abdominal aortic aneurysm) without rupture ([place]) 09/17/2021   Essential hypertension 09/17/2021   PCP:  Baxter Hire, MD Pharmacy:   [place], [place] - [address] [place] [address] Phone: [phone] Fax: [phone]     Social Determinants of Health (SDOH) Social History: SDOH Screenings   Food Insecurity: No Food Insecurity (06/14/2022)  Housing: Low Risk  (06/14/2022)  Transportation Needs: No Transportation Needs (06/14/2022)  Utilities: Not At Risk (06/14/2022)  Tobacco Use: Low Risk  (06/14/2022)   SDOH Interventions:     Readmission Risk Interventions     No data to display

## 2022-06-15 NOTE — Discharge Summary (Signed)
Physician Discharge Summary   Patient: [name] MRN: [id number] DOB: 1940/06/12  Admit date:     06/14/2022  Discharge date: 06/15/22  Discharge Physician: Elmarie Shiley   PCP: Baxter Hire, MD   Recommendations at discharge:    Follow result A1c.  Needs continue risk factor modification for stroke prevention.  Needs LFT in 1 week after initiation of Lipitor.  Follow up with neurology post stroke.   Discharge Diagnoses: Principal Problem:   TIA (transient ischemic attack) Active Problems:   AAA (abdominal aortic aneurysm) without rupture (HCC)   Essential hypertension   Hypothyroidism   Blurry vision, right eye  Resolved Problems:   * No resolved hospital problems. [facility] Course:   Assessment and Plan: TIA (transient ischemic attack) Blurry vision, right eye query TIA MRI negative for stroke.  Symptoms resolved. Likely TIA>  Started on aspirin and plavix for 3 weeks, then aspirin alone.  LDL 119. Started on Lipitor 40 mg daily.  Permissive HTN for 24 hours more per neurology recommendation.  ECHO; negative for thrombus.  Evaluated PT, OT no further recommendation.   Hypothyroidism - Continue with  Levothyroxine 50 mcg daily   Essential hypertension - Home antihypertensive medication amlodipine 10 mg daily, lisinopril 10 mg daily not resumed on admission; holding these for permissive hypertension - Per neurology continue to hold BP medication for another 24 hours.  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 -Follow up neurology out patient.    Large nasal septal perforation.  FU out patient.        Consultants: Neurology  Procedures performed: ECHO Disposition: Home Diet  recommendation:  Discharge Diet Orders (From admission, onward)     Start     Ordered   06/15/22 0000  Diet - low sodium heart healthy        06/15/22 1007           Cardiac diet DISCHARGE MEDICATION: Allergies as of 06/15/2022       Reactions   Cephalexin Swelling   Sulfa Antibiotics Swelling        Medication List     TAKE these medications    amLODipine 10 MG tablet Commonly known as: NORVASC Take 1 tablet (10 mg total) by mouth daily. Start taking on: June 16, 2022   aspirin EC 81 MG tablet Take 1 tablet (81 mg total) by mouth daily. Swallow whole. Start taking on: June 16, 2022   atorvastatin 40 MG tablet Commonly known as: LIPITOR Take 1 tablet (40 mg total) by mouth daily at 10 pm.   clopidogrel 75 MG tablet Commonly known as: PLAVIX Take 1 tablet (75 mg total) by mouth daily. Start taking on: June 16, 2022   ergocalciferol 1.25 MG (50000 UT) capsule Commonly known as: VITAMIN D2   levothyroxine 50 MCG tablet Commonly known as: SYNTHROID TAKE 1 TABLET EVERY DAY ON EMPTY STOMACHWITH A GLASS OF WATER AT LEAST 30-60 MINBEFORE BREAKFAST   lisinopril 10 MG tablet Commonly known as: ZESTRIL Take 1 tablet (10 mg total) by mouth daily. Start taking on: June 16, 2022        Discharge Exam: Danley Danker Weights   06/14/22 1330  Weight: 79.7 kg   General; NAD  Condition at discharge: stable  The  results of significant diagnostics from this hospitalization (including imaging, microbiology, ancillary and laboratory) are listed below for reference.   Imaging Studies: ECHOCARDIOGRAM COMPLETE  Result Date: 06/15/2022    ECHOCARDIOGRAM REPORT   Patient Name:   [name] Date of Exam: 06/14/2022 Medical Rec #:  [id number]    Height:       64.0 in Accession #:    [id number]   Weight:       175.8 lb Date of Birth:  03/06/1941   BSA:          1.852 m Patient Age:    82 years     BP:           153/62 mmHg Patient Gender: M            HR:            59 bpm. Exam Location:  [facility] Procedure: 2D Echo, Cardiac Doppler and Color Doppler Indications:     G45.9 TIA  History:         Patient has no prior history of Echocardiogram examinations.                  Risk Factors:Hypertension.  Sonographer:     Cresenciano Lick RDCS Referring Phys:  [id number] AMY N COX Diagnosing Phys: Harrell Gave End MD  Sonographer Comments: Technically difficult study due to poor echo windows. IMPRESSIONS  1. Left ventricular ejection fraction, by estimation, is 55 to 60%. The left ventricle has normal function. Left ventricular endocardial border not optimally defined to evaluate regional wall motion. Left ventricular diastolic parameters were normal.  2. Right ventricular systolic function is normal. The right ventricular size is normal. Tricuspid regurgitation signal is inadequate for assessing PA pressure.  3. The mitral valve is normal in structure. No evidence of mitral valve regurgitation. No evidence of mitral stenosis.  4. The aortic valve is tricuspid. There is mild thickening of the aortic valve. Aortic valve regurgitation is not visualized. Aortic valve sclerosis is present, with no evidence of aortic valve stenosis.  5. The inferior vena cava is normal in size with greater than 50% respiratory variability, suggesting right atrial pressure of 3 mmHg.  6. Interatrial shunt is not clearly seen but color Doppler flow is present near the fossa ovalis. Bubble study should be considered if there is clinical concern for right-to-left intracardiac shunting. FINDINGS  Left Ventricle: Left ventricular ejection fraction, by estimation, is 55 to 60%. The left ventricle has normal function. Left ventricular endocardial border not optimally defined to evaluate regional wall motion. The left ventricular internal cavity size was normal in size. There is no left ventricular hypertrophy. Left ventricular diastolic parameters were normal. Right Ventricle: The right ventricular size is normal.  No increase in right ventricular wall thickness. Right ventricular systolic function is normal. Tricuspid regurgitation signal is inadequate for assessing PA pressure. Left Atrium: Left atrial size was normal in size. Right Atrium: Right atrial size was normal in size. Pericardium: There is no evidence of pericardial effusion. Mitral Valve: The mitral valve is normal in structure. No evidence of mitral valve regurgitation. No evidence of mitral valve stenosis. Tricuspid Valve: The tricuspid valve is normal in structure. Tricuspid valve regurgitation is trivial. Aortic Valve: The aortic valve is tricuspid. There is mild thickening of the aortic valve. There is mild aortic valve annular calcification. Aortic valve regurgitation is not visualized. Aortic valve sclerosis is present, with no evidence of aortic valve  stenosis. Aortic valve mean gradient measures 5.6  mmHg. Aortic valve peak gradient measures 10.9 mmHg. Aortic valve area, by VTI measures 2.05 cm. Pulmonic Valve: The pulmonic valve was normal in structure. Pulmonic valve regurgitation is not visualized. No evidence of pulmonic stenosis. Aorta: The aortic root and ascending aorta are structurally normal, with no evidence of dilitation. Pulmonary Artery: The pulmonary artery is of normal size. Venous: The inferior vena cava is normal in size with greater than 50% respiratory variability, suggesting right atrial pressure of 3 mmHg. IAS/Shunts: Interatrial shunt is not clearly seen but color Doppler flow is present near the fossa ovalis. Bubble study should be considered if there is clinical concern for right-to-left intracardiac shunting.  LEFT VENTRICLE PLAX 2D LVIDd:         4.70 cm   Diastology LVIDs:         2.60 cm   LV e' medial:    8.49 cm/s LV PW:         0.70 cm   LV E/e' medial:  8.5 LV IVS:        0.70 cm   LV e' lateral:   9.90 cm/s LVOT diam:     1.90 cm   LV E/e' lateral: 7.3 LV SV:         62 LV SV Index:   33 LVOT Area:     2.84 cm  RIGHT  VENTRICLE             IVC RV Basal diam:  3.50 cm     IVC diam: 1.20 cm RV S prime:     22.15 cm/s TAPSE (M-mode): 2.4 cm LEFT ATRIUM             Index        RIGHT ATRIUM           Index LA diam:        4.00 cm 2.16 cm/m   RA Area:     11.50 cm LA Vol (A2C):   34.5 ml 18.63 ml/m  RA Volume:   26.30 ml  14.20 ml/m LA Vol (A4C):   35.1 ml 18.95 ml/m LA Biplane Vol: 37.0 ml 19.98 ml/m  AORTIC VALVE AV Area (Vmax):    1.89 cm AV Area (Vmean):   1.73 cm AV Area (VTI):     2.05 cm AV Vmax:           165.41 cm/s AV Vmean:          112.638 cm/s AV VTI:            0.301 m AV Peak Grad:      10.9 mmHg AV Mean Grad:      5.6 mmHg LVOT Vmax:         110.50 cm/s LVOT Vmean:        68.800 cm/s LVOT VTI:          0.218 m LVOT/AV VTI ratio: 0.72  AORTA Ao Root diam: 3.20 cm Ao Asc diam:  3.40 cm MITRAL VALVE MV Area (PHT): 3.60 cm    SHUNTS MV Decel Time: 211 msec    Systemic VTI:  0.22 m MV E velocity: 71.80 cm/s  Systemic Diam: 1.90 cm MV A velocity: 90.70 cm/s MV E/A ratio:  0.79 Harrell Gave End MD Electronically signed by Nelva Bush MD Signature Date/Time: 06/15/2022/6:58:48 AM    Final    MR BRAIN WO CONTRAST  Result Date: 06/14/2022 CLINICAL DATA:  Provided history: Stroke, follow-up. Blurry vision. EXAM: MRI HEAD WITHOUT CONTRAST TECHNIQUE: Multiplanar, multiecho pulse  sequences of the brain and surrounding structures were obtained without intravenous contrast. COMPARISON:  Non-contrast head CT and CT angiogram head/neck 06/14/2022. FINDINGS: A limited stroke protocol non-contrast brain MRI was performed at the ordering provider's request. The following sequences were acquired: Axial and coronal diffusion-weighted, axial T2 FLAIR and axial susceptibility-weighted. Brain: Mild generalized cerebral atrophy. Multifocal T2 FLAIR hyperintense signal abnormality within the cerebral white matter, nonspecific but compatible with mild chronic small vessel ischemic disease. There is no acute infarct. No evidence of  an intracranial mass. No chronic intracranial blood products. No extra-axial fluid collection. No midline shift. Vascular: Poorly assessed on the acquired sequences. Skull and upper cervical spine: No focal suspicious marrow lesion identified on the acquired sequences. Sinuses/Orbits: No acute orbital finding. No significant paranasal sinus disease. Nasal septal defect. IMPRESSION: 1. A non-contrast stroke protocol brain MRI was performed. 2. No evidence of an acute infarct. 3. Mild chronic small vessel ischemic changes within the cerebral white matter. 4. Mild generalized cerebral atrophy. Electronically Signed   By: Kellie Simmering D.O.   On: 06/14/2022 13:43   CT ANGIO HEAD NECK W WO CM (CODE STROKE)  Result Date: 06/14/2022 CLINICAL DATA:  Blurry vision and left-sided weakness EXAM: CT ANGIOGRAPHY HEAD AND NECK TECHNIQUE: Multidetector CT imaging of the head and neck was performed using the standard protocol during bolus administration of intravenous contrast. Multiplanar CT image reconstructions and MIPs were obtained to evaluate the vascular anatomy. Carotid stenosis measurements (when applicable) are obtained utilizing NASCET criteria, using the distal internal carotid diameter as the denominator. RADIATION DOSE REDUCTION: This exam was performed according to the departmental dose-optimization program which includes automated exposure control, adjustment of the mA and/or kV according to patient size and/or use of iterative reconstruction technique. CONTRAST:  56m OMNIPAQUE IOHEXOL 350 MG/ML SOLN COMPARISON:  Same-day head CT FINDINGS: CTA NECK FINDINGS Aortic arch: There is a mixed plaque in the imaged aortic arch. The origins of the major branch vessels are patent. The subclavian arteries are patent to the level imaged. Right carotid system: The right common, internal, and external carotid arteries are patent, with mild plaque at the bifurcation but no hemodynamically significant stenosis or occlusion.  There is no dissection or aneurysm. Left carotid system: The left common, internal, and external carotid arteries are patent, with mild noncalcified plaque in the common carotid artery and calcified plaque in the proximal internal carotid artery but no hemodynamically significant stenosis or occlusion. There is no dissection or aneurysm. 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 The vertebral artery remains patent but diminutive throughout the remainder of its course. The dominant left vertebral artery is patent, without hemodynamically significant stenosis or occlusion. There is no raised dissection flap or aneurysm. Skeleton: Postsurgical changes reflecting prior fusion at C5-C6 and C6-C7 with ACDF hardware in place at C5-C6 are noted. There is no evidence of hardware related complication. There is no acute osseous abnormality or suspicious osseous lesion. There is no visible canal hematoma. Other neck: There is a large nasal septal perforation. The soft tissues of the neck are unremarkable Upper chest: There is emphysema in the lung apices mild apical scarring. Review of the MIP images confirms the above findings CTA HEAD FINDINGS Anterior circulation: There is mild calcified plaque in the intracranial ICAs without significant stenosis. The bilateral MCAs are patent, without proximal stenosis or occlusion. The bilateral ACAs are patent, without proximal stenosis or occlusion. The anterior  communicating artery is not definitely seen. There is a 2 mm inferiorly directed outpouching arising from the right ICA terminus which could reflect a small aneurysm or infundibulum (10-112). Posterior circulation: The bilateral V4 segments are patent the right vertebral artery is diminutive in caliber proximal to the PICA origin. There is no significant stenosis of the dominant left vertebral artery. The basilar artery is patent. The  major cerebellar arteries appear patent. The bilateral PCAs are patent, without proximal stenosis or occlusion. Posterior communicating arteries are not identified. There is no aneurysm or AVM. Venous sinuses: Suboptimally evaluated due to bolus timing. No definite evidence of venous thrombosis. Anatomic variants: None. Review of the MIP images confirms the above findings IMPRESSION: 1. 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 2. Otherwise patent vasculature of the head and neck with no other occlusion. 3. Calcified plaque at the carotid bifurcations, left worse than right, without hemodynamically significant stenosis. 4. No significant stenosis in the intracranial vasculature. 5. 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 6. Large nasal septal perforation. Impression point #1 was discussed with Dr. Rory Percy at 1:07 p.m. Electronically Signed   By: Valetta Mole M.D.   On: 06/14/2022 13:14   CT HEAD CODE STROKE WO CONTRAST  Result Date: 06/14/2022 CLINICAL DATA:  Code stroke. Acute onset of blurred vision and left-sided weakness EXAM: CT HEAD WITHOUT CONTRAST TECHNIQUE: Contiguous axial images were obtained from the base of the skull through the vertex without intravenous contrast. RADIATION DOSE REDUCTION: This exam was performed according to the departmental dose-optimization program which includes automated exposure control, adjustment of the mA and/or kV according to patient size and/or use of iterative reconstruction technique. COMPARISON:  None Available. FINDINGS: Brain: Mild age related volume loss. No sign of acute infarction, mass lesion, hemorrhage, hydrocephalus or extra-axial collection. Vascular: No acute vascular finding. Skull: Normal Sinuses/Orbits: Clear/normal Other: None ASPECTS ([place] Stroke Program Early CT Score) - Ganglionic level  infarction (caudate, lentiform nuclei, internal capsule, insula, M1-M3 cortex): 7 - Supraganglionic infarction (M4-M6 cortex): 3 Total score (0-10 with 10 being normal): 10 IMPRESSION: 1. No acute CT finding. Mild age related volume loss. 2. Aspects is 10. These results were communicated to Dr. Rory Percy at 12:46 pm on 06/14/2022 by text page via the [facility] messaging system. Electronically Signed   By: Nelson Chimes M.D.   On: 06/14/2022 12:48    Microbiology: No results found for this or any previous visit.  Labs: CBC: Recent Labs  Lab 06/14/22 1251  WBC 7.8  NEUTROABS 4.0  HGB 17.0  HCT 51.1  MCV 91.7  PLT 0000000   Basic Metabolic Panel: Recent Labs  Lab 06/14/22 1251  NA 137  K 3.8  CL 103  CO2 26  GLUCOSE 105*  BUN 19  CREATININE 1.33*  CALCIUM 9.1   Liver Function Tests: Recent Labs  Lab 06/14/22 1251  AST 36  ALT 34  ALKPHOS 58  BILITOT 0.8  PROT 7.8  ALBUMIN 4.5   CBG: Recent Labs  Lab 06/14/22 1233  GLUCAP 118*    Discharge time spent: greater than 30 minutes.  Signed: Elmarie Shiley, MD [facility] 06/15/2022

## 2022-06-15 NOTE — Progress Notes (Signed)
PT Cancellation Note  Patient Details Name: [name] MRN: [id number] DOB: 1941/04/23   Cancelled Treatment:    Reason Eval/Treat Not Completed: Other (comment). Screen performed this date. Is currently at baseline level. No follow up PT indicated. Answered all questions from patient. Will dc in house.   Gerturde Kuba 06/15/2022, 10:08 AM Greggory Stallion, PT, DPT, GCS [phone]

## 2022-06-15 NOTE — Discharge Instructions (Signed)
Resume Blood pressure medications 2/22 if BP above 130/90  Take aspirin and plavix for 3 weeks, then take aspirin alone.   Needs to follow up result HbA1c with your PCP.   You have new medication for cholesterol. Lipitor.

## 2022-06-15 NOTE — Progress Notes (Addendum)
Neurology Progress Note   S:// Patient seen and examined.  Reports he did not sleep much at night.  Does not like the hospital bed.  O:// Current vital signs: BP 125/66 (BP Location: Right Arm)   Pulse (!) 53   Temp 97.9 F (36.6 C)   Resp 17   Ht 5' 4"$  (1.626 m)   Wt 79.7 kg   SpO2 98%   BMI 30.18 kg/m  Vital signs in last 24 hours: Temp:  [97 F (36.1 C)-98.2 F (36.8 C)] 97.9 F (36.6 C) (02/21 0755) Pulse Rate:  [53-61] 53 (02/21 0755) Resp:  [15-20] 17 (02/21 0755) BP: (124-182)/(61-75) 125/66 (02/21 0755) SpO2:  [95 %-98 %] 98 % (02/21 0755) Weight:  [79.7 kg] 79.7 kg (02/20 1330) Unchanged neurological exam from yesterday General: Awake, alert in NAD HEENT: - Normocephalic and atraumatic, dry mm, no LN++, no Thyromegally LUNGS - Clear to auscultation bilaterally with no wheezes CV - S1S2 RRR, no m/r/g, equal pulses bilaterally. ABDOMEN - Soft, nontender, nondistended with normoactive BS Ext: warm, well perfused, intact peripheral pulses, no edema NEURO:  Mental Status: AA&Ox3  Language: speech is nondysarthric.  Naming, repetition, fluency, and comprehension intact. Cranial Nerves: PERRL EOMI, visual fields full, no facial asymmetry, facial sensation intact, hearing i severely reduced bilaterally, tongue/uvula/soft palate midline, normal sternocleidomastoid and trapezius muscle strength. No evidence of tongue atrophy or fibrillations Motor: 5/5 in all fours with no drift Tone: is normal and bulk is normal Sensation- Intact to light touch bilaterally Coordination: FTN intact bilaterally, no ataxia in BLE. Gait- deferred NIHSS-0   Medications  Current Facility-Administered Medications:    acetaminophen (TYLENOL) tablet 650 mg, 650 mg, Oral, Q4H PRN **OR** acetaminophen (TYLENOL) 160 MG/5ML solution 650 mg, 650 mg, Per Tube, Q4H PRN **OR** acetaminophen (TYLENOL) suppository 650 mg, 650 mg, Rectal, Q4H PRN, Cox, Amy N, DO   aspirin EC tablet 81 mg, 81 mg, Oral,  Daily, Regalado, Belkys A, MD, 81 mg at 06/15/22 0842   atorvastatin (LIPITOR) tablet 80 mg, 80 mg, Oral, Q2200, Regalado, Belkys A, MD   clopidogrel (PLAVIX) tablet 75 mg, 75 mg, Oral, Daily, Regalado, Belkys A, MD, 75 mg at 06/15/22 0839   enoxaparin (LOVENOX) injection 40 mg, 40 mg, Subcutaneous, Q24H, Cox, Amy N, DO, 40 mg at 06/14/22 2108   hydrALAZINE (APRESOLINE) injection 5 mg, 5 mg, Intravenous, Q6H PRN, Cox, Amy N, DO   labetalol (NORMODYNE) injection 5 mg, 5 mg, Intravenous, Q2H PRN, Cox, Amy N, DO   levothyroxine (SYNTHROID) tablet 50 mcg, 50 mcg, Oral, Q0600, Cox, Amy N, DO, 50 mcg at 06/15/22 0527   senna-docusate (Senokot-S) tablet 1 tablet, 1 tablet, Oral, QHS PRN, Cox, Amy N, DO  Labs CBC    Component Value Date/Time   WBC 7.8 06/14/2022 1251   RBC 5.57 06/14/2022 1251   HGB 17.0 06/14/2022 1251   HGB 14.5 09/15/2013 0607   HCT 51.1 06/14/2022 1251   HCT 43.8 09/15/2013 0607   PLT 193 06/14/2022 1251   PLT 156 09/15/2013 0607   MCV 91.7 06/14/2022 1251   MCV 96 09/15/2013 0607   MCH 30.5 06/14/2022 1251   MCHC 33.3 06/14/2022 1251   RDW 13.4 06/14/2022 1251   RDW 14.7 (H) 09/15/2013 0607   LYMPHSABS 3.2 06/14/2022 1251   LYMPHSABS 1.4 09/15/2013 0607   MONOABS 0.5 06/14/2022 1251   MONOABS 0.6 09/15/2013 0607   EOSABS 0.1 06/14/2022 1251   EOSABS 0.1 09/15/2013 0607   BASOSABS 0.1 06/14/2022 1251  BASOSABS 0.0 09/15/2013 0607    CMP     Component Value Date/Time   NA 137 06/14/2022 1251   NA 138 09/15/2013 0607   K 3.8 06/14/2022 1251   K 3.8 09/15/2013 0607   CL 103 06/14/2022 1251   CL 109 (H) 09/15/2013 0607   CO2 26 06/14/2022 1251   CO2 23 09/15/2013 0607   GLUCOSE 105 (H) 06/14/2022 1251   GLUCOSE 81 09/15/2013 0607   BUN 19 06/14/2022 1251   BUN 13 09/15/2013 0607   CREATININE 1.33 (H) 06/14/2022 1251   CREATININE 1.26 09/15/2013 0607   CALCIUM 9.1 06/14/2022 1251   CALCIUM 8.3 (L) 09/15/2013 0607   PROT 7.8 06/14/2022 1251   PROT 7.2  09/15/2013 0607   ALBUMIN 4.5 06/14/2022 1251   ALBUMIN 2.9 (L) 09/15/2013 0607   AST 36 06/14/2022 1251   AST 22 09/15/2013 0607   ALT 34 06/14/2022 1251   ALT 13 09/15/2013 0607   ALKPHOS 58 06/14/2022 1251   ALKPHOS 43 (L) 09/15/2013 0607   BILITOT 0.8 06/14/2022 1251   BILITOT 0.7 09/15/2013 0607   GFRNONAA 53 (L) 06/14/2022 1251   GFRNONAA 56 (L) 09/15/2013 0607   GFRAA >60 09/15/2013 0607    glycosylated hemoglobin--pending  Lipid Panel     Component Value Date/Time   CHOL 190 06/15/2022 0312   TRIG 205 (H) 06/15/2022 0312   TRIG 170 09/13/2013 0626   HDL 30 (L) 06/15/2022 0312   CHOLHDL 6.3 06/15/2022 0312   VLDL 41 (H) 06/15/2022 0312   LDLCALC 119 (H) 06/15/2022 0312   2D echocardiogram Normal LVEF Normal left atrial size No clear IAS by color-flow Doppler. Can do bubble if needed but at this age, I would not recommend that.   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  No emergent LVO.  Calcified plaque at bilateral carotid bifurcations left worse than right without hemodynamically significant stenosis.  2 mm infundibulum from the right ICA terminus.  Large nasal septal perforation. 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  His symptoms had resolved by the time he presented to the ER-not a candidate for IVT or thrombectomy. Given the nature of his symptoms, admitted for TIA risk factor workup. 2D echocardiogram has been unrevealing.  No evidence of arrhythmia on telemetry.  His LDL is 119, goal has to be  less than 70.  His A1c is pending. I suspect this to be a left occipital TIA.  Recommendations: PT OT evaluations Aspirin 81+ Plavix 75 for 3 weeks followed by Aspirin only Blood pressure management: Can allow permissive hypertension for another day or 2 and then the Bottino-term blood pressure goal should be normotension. I would recommend atorvastatin 40 mg daily, for goal LDL of less than 70-needs to follow-up with PCP Outpatient neurology follow-up in 8 to 12 weeks with [facility] Outpatient primary care follow-up-as scheduled for next month-I have asked him to keep that appointment. A1c results are pending-goal A1c is less than 7.  Follow-up with primary care. Plan was relayed to Dr. Tyrell Antonio  Please call with  questions  -- Amie Portland, MD Neurologist [facility] Pager: [phone]

## 2022-06-15 NOTE — Progress Notes (Signed)
Mobility Specialist - Progress Note   06/15/22 0857  Mobility  Activity Ambulated independently in hallway  Level of Assistance Independent  Assistive Device None  Distance Ambulated (ft) 180 ft  Activity Response Tolerated well  Mobility Referral Yes  $Mobility charge 1 Mobility   Pt supine in bed on RA upon arrival. Pt STS and ambulates in hallway indep with no LOB noted. Pt returns to bed with needs in reach and visitors in room.   Gretchen Short  Mobility Specialist  06/15/22 8:59 AM

## 2022-06-15 NOTE — Progress Notes (Addendum)
SLP Cancellation Note  Patient Details Name: [name] MRN: [id number] DOB: [birth year]   Cancelled treatment:       Reason Eval/Treat Not Completed: SLP screened, no needs identified, will sign off. Consult received and appreciated. Charted review. Met w/ pt and family in room. Pt reported no difficulties in speech, expressive/receptive language, cognitive skills, nor swallowing. Pt reported no apparent deficits in speech (pt baseline HOH); family agreed speech is at baseline. Pt appropriately answered a variety of questions (yes/no, open-ended) w/ no overt difficulties. Communicated using complete sentences and appropriate pragmatic skills. Reported he ate breakfast this morning w/ no apparent difficulties swallowing.   Pt encouraged to talk to RN/MD if any new needs arise during this admission and contact PCP if he notices any residual deficits/changes upon returning to ADLs. Pt/family appreciative and agreed.  No acute skilled ST needs at this time. ST services will s/o. MD to reconsult if any new needs arise during admit.     Randall Hiss Graduate Clinician [place], Speech Pathology  Randall Hiss 06/15/2022, 10:03 AM  The information in this patient note, response to treatment, and overall treatment plan developed has been reviewed and agreed upon after reviewing documentation. This session was performed under the supervision of this licensed clinician.   Orinda Kenner, [place], [place] [phone] 06/15/2022, 10:12am

## 2022-06-15 NOTE — Progress Notes (Signed)
Patient DC home with family. No concerns at discharge

## 2022-06-24 DIAGNOSIS — E782 Mixed hyperlipidemia: Secondary | ICD-10-CM | POA: Diagnosis not present

## 2022-06-24 DIAGNOSIS — N2581 Secondary hyperparathyroidism of renal origin: Secondary | ICD-10-CM | POA: Diagnosis not present

## 2022-06-24 DIAGNOSIS — Z8673 Personal history of transient ischemic attack (TIA), and cerebral infarction without residual deficits: Secondary | ICD-10-CM | POA: Diagnosis not present

## 2022-06-24 DIAGNOSIS — N1832 Chronic kidney disease, stage 3b: Secondary | ICD-10-CM | POA: Diagnosis not present

## 2022-06-24 DIAGNOSIS — E034 Atrophy of thyroid (acquired): Secondary | ICD-10-CM | POA: Diagnosis not present

## 2022-06-24 DIAGNOSIS — E1122 Type 2 diabetes mellitus with diabetic chronic kidney disease: Secondary | ICD-10-CM | POA: Diagnosis not present

## 2022-06-24 DIAGNOSIS — G4733 Obstructive sleep apnea (adult) (pediatric): Secondary | ICD-10-CM | POA: Diagnosis not present

## 2022-06-24 DIAGNOSIS — Z0000 Encounter for general adult medical examination without abnormal findings: Secondary | ICD-10-CM | POA: Diagnosis not present

## 2022-06-24 DIAGNOSIS — I10 Essential (primary) hypertension: Secondary | ICD-10-CM | POA: Diagnosis not present

## 2022-07-06 DIAGNOSIS — G459 Transient cerebral ischemic attack, unspecified: Secondary | ICD-10-CM | POA: Diagnosis not present

## 2022-07-06 DIAGNOSIS — H53462 Homonymous bilateral field defects, left side: Secondary | ICD-10-CM | POA: Diagnosis not present

## 2022-07-06 DIAGNOSIS — H53453 Other localized visual field defect, bilateral: Secondary | ICD-10-CM | POA: Diagnosis not present

## 2022-08-01 DIAGNOSIS — N2581 Secondary hyperparathyroidism of renal origin: Secondary | ICD-10-CM | POA: Diagnosis not present

## 2022-08-01 DIAGNOSIS — E1122 Type 2 diabetes mellitus with diabetic chronic kidney disease: Secondary | ICD-10-CM | POA: Diagnosis not present

## 2022-08-01 DIAGNOSIS — I129 Hypertensive chronic kidney disease with stage 1 through stage 4 chronic kidney disease, or unspecified chronic kidney disease: Secondary | ICD-10-CM | POA: Diagnosis not present

## 2022-08-01 DIAGNOSIS — N1832 Chronic kidney disease, stage 3b: Secondary | ICD-10-CM | POA: Diagnosis not present

## 2022-08-01 DIAGNOSIS — M1000 Idiopathic gout, unspecified site: Secondary | ICD-10-CM | POA: Diagnosis not present

## 2022-08-01 DIAGNOSIS — R310 Gross hematuria: Secondary | ICD-10-CM | POA: Diagnosis not present

## 2022-08-01 DIAGNOSIS — R809 Proteinuria, unspecified: Secondary | ICD-10-CM | POA: Diagnosis not present

## 2022-08-01 DIAGNOSIS — I10 Essential (primary) hypertension: Secondary | ICD-10-CM | POA: Diagnosis not present

## 2022-08-01 DIAGNOSIS — R319 Hematuria, unspecified: Secondary | ICD-10-CM | POA: Diagnosis not present

## 2022-08-08 DIAGNOSIS — M5489 Other dorsalgia: Secondary | ICD-10-CM | POA: Diagnosis not present

## 2022-08-08 DIAGNOSIS — R0789 Other chest pain: Secondary | ICD-10-CM | POA: Diagnosis not present

## 2022-08-09 DIAGNOSIS — R809 Proteinuria, unspecified: Secondary | ICD-10-CM | POA: Diagnosis not present

## 2022-08-09 DIAGNOSIS — N2581 Secondary hyperparathyroidism of renal origin: Secondary | ICD-10-CM | POA: Diagnosis not present

## 2022-08-09 DIAGNOSIS — I129 Hypertensive chronic kidney disease with stage 1 through stage 4 chronic kidney disease, or unspecified chronic kidney disease: Secondary | ICD-10-CM | POA: Diagnosis not present

## 2022-08-09 DIAGNOSIS — E1122 Type 2 diabetes mellitus with diabetic chronic kidney disease: Secondary | ICD-10-CM | POA: Diagnosis not present

## 2022-08-09 DIAGNOSIS — N1832 Chronic kidney disease, stage 3b: Secondary | ICD-10-CM | POA: Diagnosis not present

## 2022-09-21 ENCOUNTER — Other Ambulatory Visit (INDEPENDENT_AMBULATORY_CARE_PROVIDER_SITE_OTHER): Payer: Self-pay | Admitting: Vascular Surgery

## 2022-09-21 DIAGNOSIS — I7143 Infrarenal abdominal aortic aneurysm, without rupture: Secondary | ICD-10-CM

## 2022-09-22 ENCOUNTER — Other Ambulatory Visit (INDEPENDENT_AMBULATORY_CARE_PROVIDER_SITE_OTHER): Payer: Self-pay | Admitting: Vascular Surgery

## 2022-09-22 DIAGNOSIS — G459 Transient cerebral ischemic attack, unspecified: Secondary | ICD-10-CM

## 2022-09-23 ENCOUNTER — Ambulatory Visit (INDEPENDENT_AMBULATORY_CARE_PROVIDER_SITE_OTHER): Payer: PPO

## 2022-09-23 ENCOUNTER — Ambulatory Visit (INDEPENDENT_AMBULATORY_CARE_PROVIDER_SITE_OTHER): Payer: HMO | Admitting: Vascular Surgery

## 2022-09-23 ENCOUNTER — Encounter (INDEPENDENT_AMBULATORY_CARE_PROVIDER_SITE_OTHER): Payer: Self-pay

## 2022-09-23 DIAGNOSIS — I7143 Infrarenal abdominal aortic aneurysm, without rupture: Secondary | ICD-10-CM

## 2022-09-23 DIAGNOSIS — G459 Transient cerebral ischemic attack, unspecified: Secondary | ICD-10-CM | POA: Diagnosis not present

## 2022-12-21 DIAGNOSIS — E1122 Type 2 diabetes mellitus with diabetic chronic kidney disease: Secondary | ICD-10-CM | POA: Diagnosis not present

## 2022-12-21 DIAGNOSIS — N1832 Chronic kidney disease, stage 3b: Secondary | ICD-10-CM | POA: Diagnosis not present

## 2022-12-21 DIAGNOSIS — E034 Atrophy of thyroid (acquired): Secondary | ICD-10-CM | POA: Diagnosis not present

## 2022-12-29 DIAGNOSIS — E782 Mixed hyperlipidemia: Secondary | ICD-10-CM | POA: Diagnosis not present

## 2022-12-29 DIAGNOSIS — E034 Atrophy of thyroid (acquired): Secondary | ICD-10-CM | POA: Diagnosis not present

## 2022-12-29 DIAGNOSIS — I10 Essential (primary) hypertension: Secondary | ICD-10-CM | POA: Diagnosis not present

## 2022-12-29 DIAGNOSIS — E1122 Type 2 diabetes mellitus with diabetic chronic kidney disease: Secondary | ICD-10-CM | POA: Diagnosis not present

## 2022-12-29 DIAGNOSIS — G4733 Obstructive sleep apnea (adult) (pediatric): Secondary | ICD-10-CM | POA: Diagnosis not present

## 2022-12-29 DIAGNOSIS — Z8673 Personal history of transient ischemic attack (TIA), and cerebral infarction without residual deficits: Secondary | ICD-10-CM | POA: Diagnosis not present

## 2022-12-29 DIAGNOSIS — N1832 Chronic kidney disease, stage 3b: Secondary | ICD-10-CM | POA: Diagnosis not present

## 2022-12-29 DIAGNOSIS — Z0001 Encounter for general adult medical examination with abnormal findings: Secondary | ICD-10-CM | POA: Diagnosis not present

## 2023-01-19 DIAGNOSIS — H53453 Other localized visual field defect, bilateral: Secondary | ICD-10-CM | POA: Diagnosis not present

## 2023-01-19 DIAGNOSIS — H26493 Other secondary cataract, bilateral: Secondary | ICD-10-CM | POA: Diagnosis not present

## 2023-01-19 DIAGNOSIS — H35033 Hypertensive retinopathy, bilateral: Secondary | ICD-10-CM | POA: Diagnosis not present

## 2023-01-19 DIAGNOSIS — H16223 Keratoconjunctivitis sicca, not specified as Sjogren's, bilateral: Secondary | ICD-10-CM | POA: Diagnosis not present

## 2023-01-19 DIAGNOSIS — H02831 Dermatochalasis of right upper eyelid: Secondary | ICD-10-CM | POA: Diagnosis not present

## 2023-01-19 DIAGNOSIS — I10 Essential (primary) hypertension: Secondary | ICD-10-CM | POA: Diagnosis not present

## 2023-01-19 DIAGNOSIS — G459 Transient cerebral ischemic attack, unspecified: Secondary | ICD-10-CM | POA: Diagnosis not present

## 2023-01-19 DIAGNOSIS — H524 Presbyopia: Secondary | ICD-10-CM | POA: Diagnosis not present

## 2023-01-19 DIAGNOSIS — H0288B Meibomian gland dysfunction left eye, upper and lower eyelids: Secondary | ICD-10-CM | POA: Diagnosis not present

## 2023-01-19 DIAGNOSIS — R7303 Prediabetes: Secondary | ICD-10-CM | POA: Diagnosis not present

## 2023-01-19 DIAGNOSIS — H0288A Meibomian gland dysfunction right eye, upper and lower eyelids: Secondary | ICD-10-CM | POA: Diagnosis not present

## 2023-01-19 DIAGNOSIS — H02834 Dermatochalasis of left upper eyelid: Secondary | ICD-10-CM | POA: Diagnosis not present

## 2023-02-02 DIAGNOSIS — N2581 Secondary hyperparathyroidism of renal origin: Secondary | ICD-10-CM | POA: Diagnosis not present

## 2023-02-02 DIAGNOSIS — E1122 Type 2 diabetes mellitus with diabetic chronic kidney disease: Secondary | ICD-10-CM | POA: Diagnosis not present

## 2023-02-02 DIAGNOSIS — N1832 Chronic kidney disease, stage 3b: Secondary | ICD-10-CM | POA: Diagnosis not present

## 2023-02-02 DIAGNOSIS — I129 Hypertensive chronic kidney disease with stage 1 through stage 4 chronic kidney disease, or unspecified chronic kidney disease: Secondary | ICD-10-CM | POA: Diagnosis not present

## 2023-02-02 DIAGNOSIS — R809 Proteinuria, unspecified: Secondary | ICD-10-CM | POA: Diagnosis not present

## 2023-02-08 DIAGNOSIS — E1122 Type 2 diabetes mellitus with diabetic chronic kidney disease: Secondary | ICD-10-CM | POA: Diagnosis not present

## 2023-02-08 DIAGNOSIS — N2581 Secondary hyperparathyroidism of renal origin: Secondary | ICD-10-CM | POA: Diagnosis not present

## 2023-02-08 DIAGNOSIS — R809 Proteinuria, unspecified: Secondary | ICD-10-CM | POA: Diagnosis not present

## 2023-02-08 DIAGNOSIS — N1832 Chronic kidney disease, stage 3b: Secondary | ICD-10-CM | POA: Diagnosis not present

## 2023-02-08 DIAGNOSIS — I129 Hypertensive chronic kidney disease with stage 1 through stage 4 chronic kidney disease, or unspecified chronic kidney disease: Secondary | ICD-10-CM | POA: Diagnosis not present

## 2023-02-08 DIAGNOSIS — R319 Hematuria, unspecified: Secondary | ICD-10-CM | POA: Diagnosis not present

## 2023-05-12 ENCOUNTER — Other Ambulatory Visit (HOSPITAL_COMMUNITY): Payer: Self-pay

## 2023-06-19 DIAGNOSIS — E034 Atrophy of thyroid (acquired): Secondary | ICD-10-CM | POA: Diagnosis not present

## 2023-06-19 DIAGNOSIS — E1122 Type 2 diabetes mellitus with diabetic chronic kidney disease: Secondary | ICD-10-CM | POA: Diagnosis not present

## 2023-06-19 DIAGNOSIS — N1832 Chronic kidney disease, stage 3b: Secondary | ICD-10-CM | POA: Diagnosis not present

## 2023-06-23 DIAGNOSIS — H903 Sensorineural hearing loss, bilateral: Secondary | ICD-10-CM | POA: Diagnosis not present

## 2023-06-29 DIAGNOSIS — I10 Essential (primary) hypertension: Secondary | ICD-10-CM | POA: Diagnosis not present

## 2023-06-29 DIAGNOSIS — E1122 Type 2 diabetes mellitus with diabetic chronic kidney disease: Secondary | ICD-10-CM | POA: Diagnosis not present

## 2023-06-29 DIAGNOSIS — E034 Atrophy of thyroid (acquired): Secondary | ICD-10-CM | POA: Diagnosis not present

## 2023-06-29 DIAGNOSIS — E782 Mixed hyperlipidemia: Secondary | ICD-10-CM | POA: Diagnosis not present

## 2023-06-29 DIAGNOSIS — N1832 Chronic kidney disease, stage 3b: Secondary | ICD-10-CM | POA: Diagnosis not present

## 2023-08-23 DIAGNOSIS — N1832 Chronic kidney disease, stage 3b: Secondary | ICD-10-CM | POA: Diagnosis not present

## 2023-08-23 DIAGNOSIS — N2581 Secondary hyperparathyroidism of renal origin: Secondary | ICD-10-CM | POA: Diagnosis not present

## 2023-08-23 DIAGNOSIS — R809 Proteinuria, unspecified: Secondary | ICD-10-CM | POA: Diagnosis not present

## 2023-08-23 DIAGNOSIS — R319 Hematuria, unspecified: Secondary | ICD-10-CM | POA: Diagnosis not present

## 2023-08-23 DIAGNOSIS — E1122 Type 2 diabetes mellitus with diabetic chronic kidney disease: Secondary | ICD-10-CM | POA: Diagnosis not present

## 2023-08-23 DIAGNOSIS — I129 Hypertensive chronic kidney disease with stage 1 through stage 4 chronic kidney disease, or unspecified chronic kidney disease: Secondary | ICD-10-CM | POA: Diagnosis not present

## 2023-09-01 IMAGING — CT CT ABD-PELV W/O CM
2 of 4 series · 15 of 46 positions shown, 17 images · non-contrast
Comparison: 08/13/2014

CLINICAL DATA: Hematuria flank pain x2 weeks



[Series 2: routine abd/pel wo · axial · 0.77mm/px · z∈[-545,-90]mm · 12 of 101 slices shown, 14 images]
[im 5/101  soft-tissue]
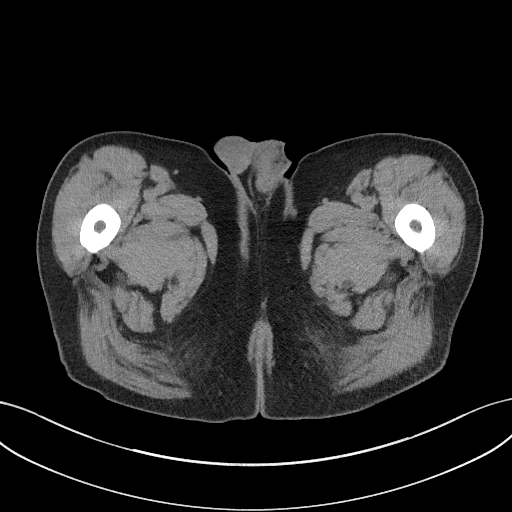
[im 5/101  bone]
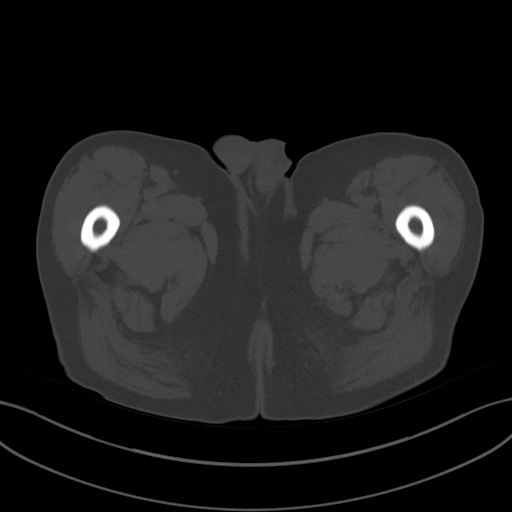
[im 13/101  soft-tissue]
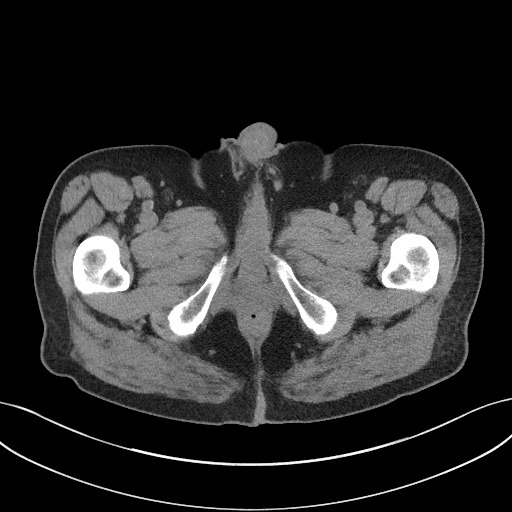
[im 21/101  soft-tissue]
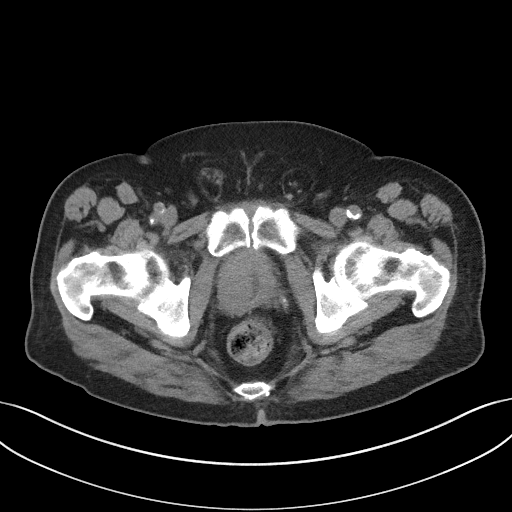
[im 30/101  soft-tissue]
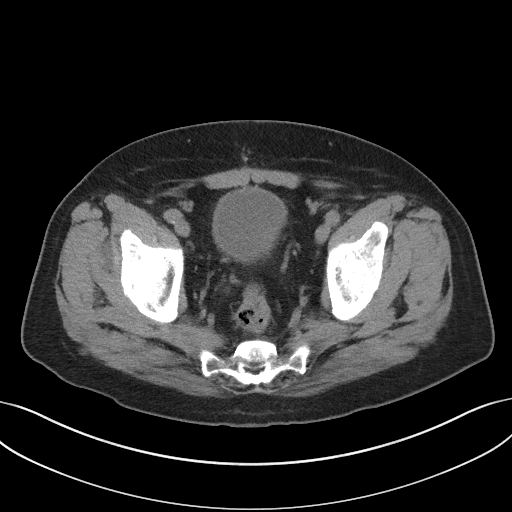
[im 38/101  soft-tissue]
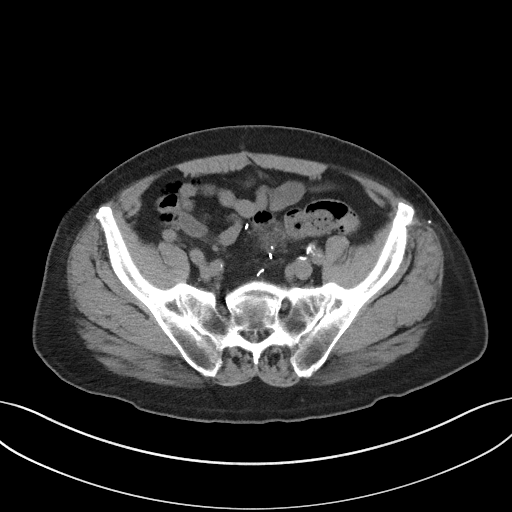
[im 46/101  soft-tissue]
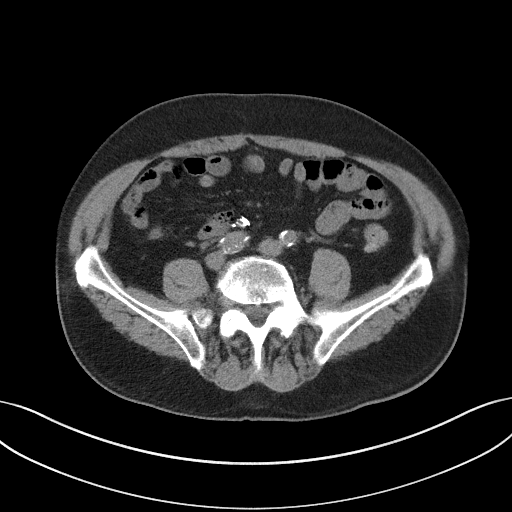
[im 55/101  soft-tissue]
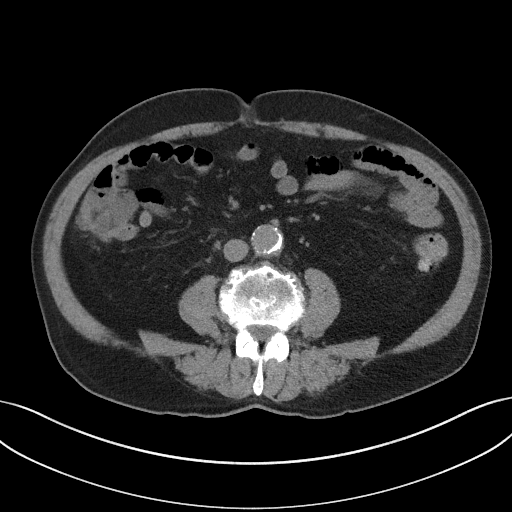
[im 63/101  soft-tissue]
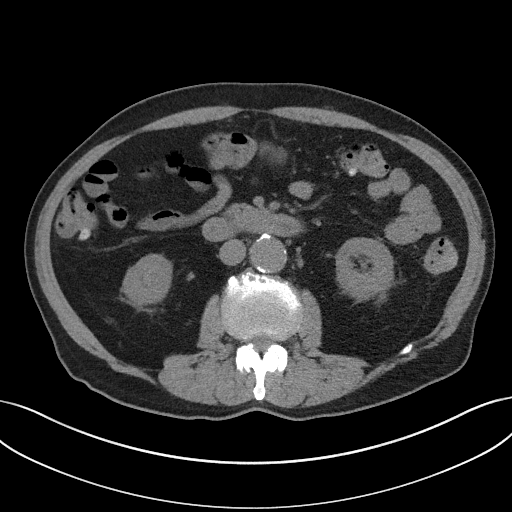
[im 71/101  soft-tissue]
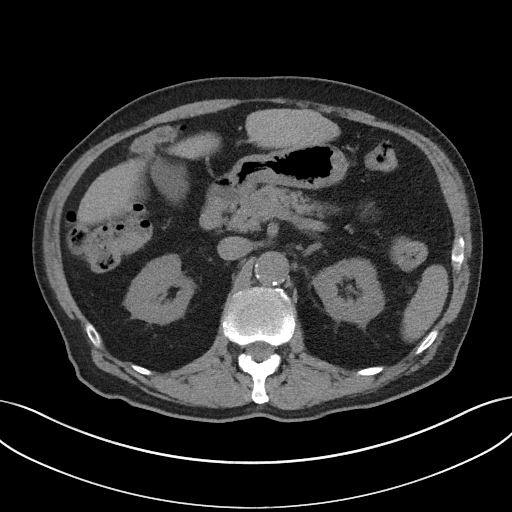
[im 71/101  bone]
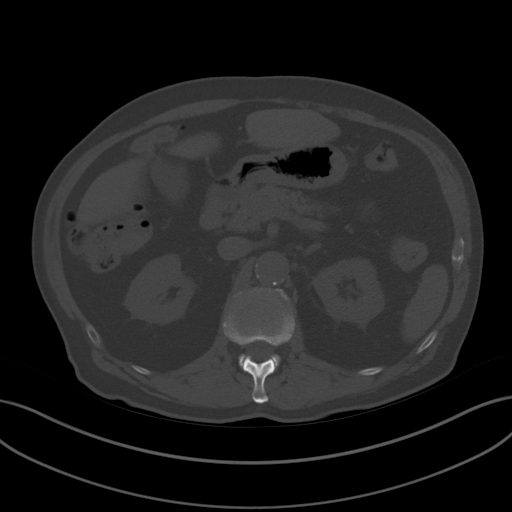
[im 80/101  soft-tissue]
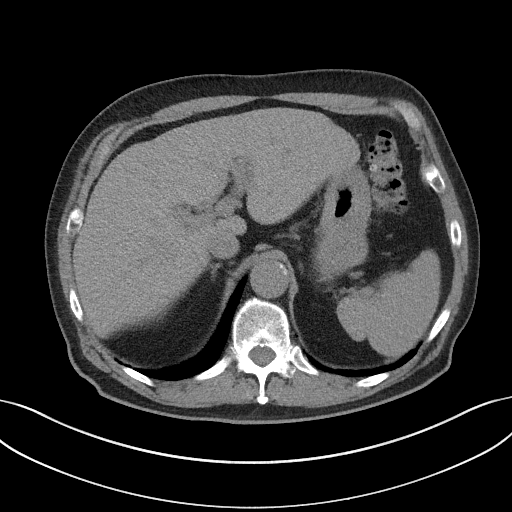
[im 88/101  soft-tissue]
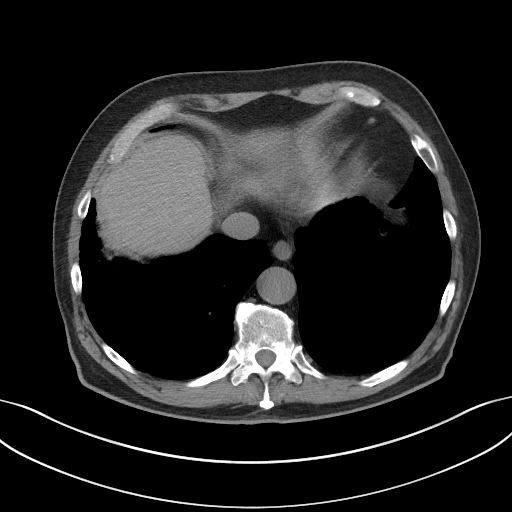
[im 96/101  soft-tissue]
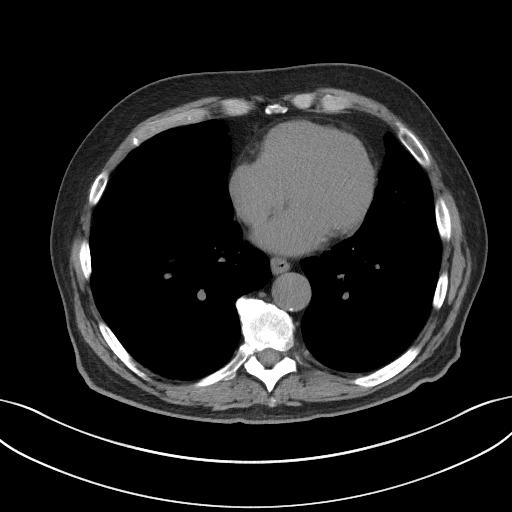

[Series 5: coronal st · coronal · 0.82mm/px · 3 of 95 slices shown]
[im 32/95  soft-tissue]
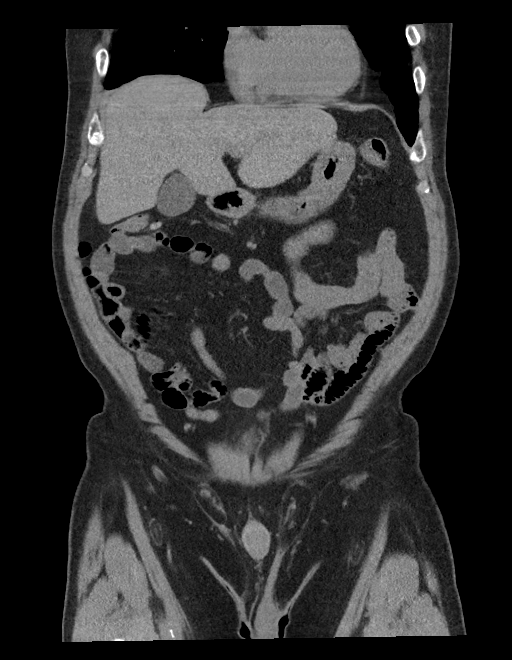
[im 42/95  soft-tissue]
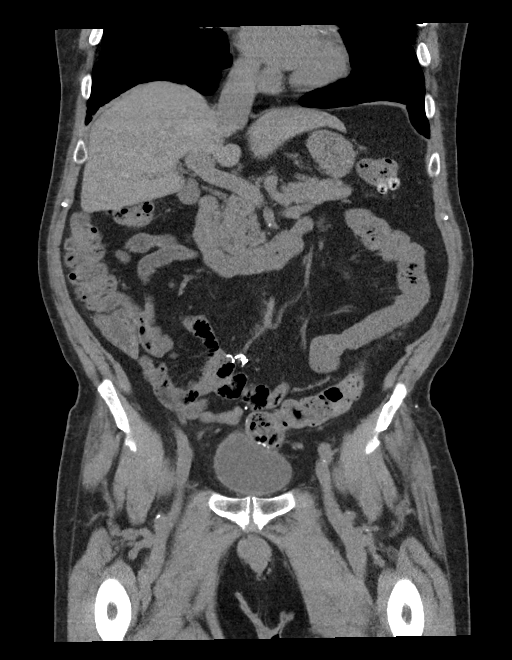
[im 53/95  soft-tissue]
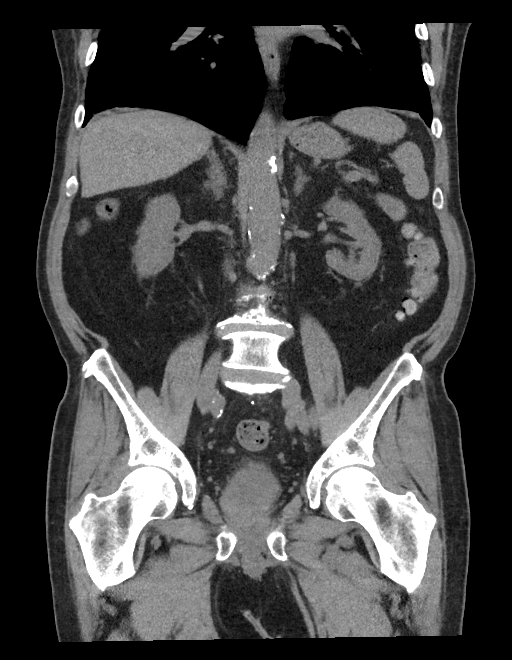

[15 of 46 positions shown; findings below may reference images not displayed]

FINDINGS: Lower chest: There is slight prominence of interstitial markings in
the periphery of lower lung fields suggesting scarring. Coronary
artery calcifications are seen.

Hepatobiliary: No focal abnormality is seen in the liver. There is
no dilation of bile ducts. Gallbladder is unremarkable.

Pancreas: No focal abnormality is seen.

Spleen: Unremarkable.

Adrenals/Urinary Tract: Adrenals are not enlarged. There is no
hydronephrosis. There are no renal or ureteral stones. Urinary
bladder is not distended.

Stomach/Bowel: Stomach is not distended. Small bowel loops are not
dilated. Appendix is slightly larger than usual in caliber measuring
8 mm. There is air in the lumen of appendix. There is no pericecal
stranding. Numerous diverticula are seen in the colon. There is no
evidence of focal acute diverticulitis. Surgical clips are seen in
the pelvis.

Vascular/Lymphatic: There is 3.4 x 3.1 cm infrarenal aortic
aneurysm. This aneurysm measures 3 cm in diameter in the previous
study. There is 2 cm aneurysm in the left common iliac artery. There
is 1.7 cm aneurysm in the right common iliac artery. Fairly
extensive calcifications are seen in the aorta and its major
branches.

Reproductive: Prostate is enlarged. There is asymmetric prominence
of right side of the prostate which appears to be causing extrinsic
pressure over the prostatic urethra. There are scattered coarse
calcifications in the prostate.

Other: There is no ascites or pneumoperitoneum. Small umbilical
hernia containing fat is seen. Bilateral inguinal hernias containing
fat are seen, larger on the left side.

Musculoskeletal: Schmorl's nodes are seen in the body L4 vertebra.
There is minimal retrolisthesis at L4-L5 level. There is
encroachment of neural foramina at multiple levels in the lumbar
spine.
IMPRESSION: There is no hydronephrosis. There are no renal or ureteral stones.
There is no evidence of intestinal obstruction or pneumoperitoneum.

There is 3.4 cm infrarenal aortic aneurysm. There is 2 cm aneurysm
in the left common iliac artery. There is 1.7 cm aneurysm in the
right common iliac artery. Vascular surgery consultation and
follow-up CT every 3 years may be considered.

Diverticulosis of colon without signs of focal diverticulitis.
Coronary artery calcifications are seen. Lumbar spondylosis.

Enlarged prostate. There is asymmetric prominence of right side of
the prostate which appears to be causing extrinsic pressure over the
prostatic urethra. This may be due to benign prostatic hypertrophy
or neoplastic process.

Other findings as described in the body of the report.

## 2023-09-20 ENCOUNTER — Other Ambulatory Visit (INDEPENDENT_AMBULATORY_CARE_PROVIDER_SITE_OTHER): Payer: Self-pay | Admitting: Vascular Surgery

## 2023-09-20 ENCOUNTER — Encounter (INDEPENDENT_AMBULATORY_CARE_PROVIDER_SITE_OTHER): Payer: Self-pay | Admitting: Vascular Surgery

## 2023-09-20 DIAGNOSIS — I724 Aneurysm of artery of lower extremity: Secondary | ICD-10-CM

## 2023-09-20 DIAGNOSIS — I7143 Infrarenal abdominal aortic aneurysm, without rupture: Secondary | ICD-10-CM

## 2023-09-20 DIAGNOSIS — I6523 Occlusion and stenosis of bilateral carotid arteries: Secondary | ICD-10-CM

## 2023-09-26 ENCOUNTER — Ambulatory Visit (INDEPENDENT_AMBULATORY_CARE_PROVIDER_SITE_OTHER): Payer: PPO

## 2023-09-26 ENCOUNTER — Encounter (INDEPENDENT_AMBULATORY_CARE_PROVIDER_SITE_OTHER): Payer: Self-pay | Admitting: Vascular Surgery

## 2023-09-26 ENCOUNTER — Ambulatory Visit (INDEPENDENT_AMBULATORY_CARE_PROVIDER_SITE_OTHER): Payer: PPO | Admitting: Vascular Surgery

## 2023-09-26 VITALS — BP 156/90 | HR 60 | Resp 18 | Ht 64.0 in | Wt 160.0 lb

## 2023-09-26 DIAGNOSIS — I7143 Infrarenal abdominal aortic aneurysm, without rupture: Secondary | ICD-10-CM

## 2023-09-26 DIAGNOSIS — I6523 Occlusion and stenosis of bilateral carotid arteries: Secondary | ICD-10-CM | POA: Diagnosis not present

## 2023-09-26 DIAGNOSIS — E039 Hypothyroidism, unspecified: Secondary | ICD-10-CM | POA: Diagnosis not present

## 2023-09-26 DIAGNOSIS — I10 Essential (primary) hypertension: Secondary | ICD-10-CM

## 2023-09-28 ENCOUNTER — Encounter (INDEPENDENT_AMBULATORY_CARE_PROVIDER_SITE_OTHER): Payer: Self-pay | Admitting: Vascular Surgery

## 2023-09-28 NOTE — Progress Notes (Signed)
 Subjective:    Patient ID: [name], male    DOB: 09-Nov-1940, 83 y.o.   MRN: [id number] Chief Complaint  Patient presents with   Follow-up    1 year AAA/LEA. carotid.    [name] is an 83 year old male presents to clinic today for a 1 year follow-up of bilateral carotid stenosis, arterial duplex ultrasounds of bilateral lower extremities, and aortic ultrasound to evaluate prior AAA.  Patient has no complaints today.  He is not having any pain or difficulties in his legs while walking.  He denies having any stroke symptoms or TIA symptoms.  He denies any headaches blurry vision nausea vomiting or diarrhea in the past year.  He denies any flank pain or low back pain or abdominal pain.  Patient states he is here for his annual follow-up to check his AAA, carotid arteries and his legs.    Review of Systems  Constitutional: Negative.   Respiratory: Negative.    Cardiovascular: Negative.   Musculoskeletal: Negative.   Neurological: Negative.   Psychiatric/Behavioral: Negative.    All other systems reviewed and are negative.      Objective:    Physical Exam Constitutional:      Appearance: Normal appearance. He is normal weight.  HENT:     Head: Normocephalic.  Eyes:     Pupils: Pupils are equal, round, and reactive to light.  Cardiovascular:     Rate and Rhythm: Normal rate and regular rhythm.     Pulses: Normal pulses.     Heart sounds: Normal heart sounds.  Pulmonary:     Effort: Pulmonary effort is normal.     Breath sounds: Normal breath sounds.  Abdominal:     General: Abdomen is flat. Bowel sounds are normal.     Palpations: Abdomen is soft.  Musculoskeletal:        General: Normal range of motion.     Cervical back: Normal range of motion.  Skin:    General: Skin is warm and dry.     Capillary Refill: Capillary refill takes 2 to 3 seconds.  Neurological:     General: No focal deficit present.     Mental Status: He is alert and oriented to person, place,  and time. Mental status is at baseline.  Psychiatric:        Mood and Affect: Mood normal.        Behavior: Behavior normal.        Thought Content: Thought content normal.        Judgment: Judgment normal.     BP (!) 156/90   Pulse 60   Resp 18   Ht 5\' 4"  (1.626 m)   Wt 160 lb (72.6 kg)   BMI 27.46 kg/m   Past Medical History:  Diagnosis Date   Hypertension    Hypothyroid     Social History   Socioeconomic History   Marital status: Married    Spouse name: Not on file   Number of children: Not on file   Years of education: Not on file   Highest education level: Not on file  Occupational History   Not on file  Tobacco Use   Smoking status: Never    Passive exposure: Never   Smokeless tobacco: Never  Substance and Sexual Activity   Alcohol  use: Not on file   Drug use: Never   Sexual activity: Not Currently  Other Topics Concern   Not on file  Social History Narrative   Not on  file   Social Drivers of Health   Financial Resource Strain: Not on file  Food Insecurity: No Food Insecurity (06/14/2022)   Hunger Vital Sign    Worried About Running Out of Food in the Last Year: Never true    Ran Out of Food in the Last Year: Never true  Transportation Needs: No Transportation Needs (06/14/2022)   PRAPARE - Administrator, Civil Service (Medical): No    Lack of Transportation (Non-Medical): No  Physical Activity: Not on file  Stress: Not on file  Social Connections: Not on file  Intimate Partner Violence: Not At Risk (06/14/2022)   Humiliation, Afraid, Rape, and Kick questionnaire    Fear of Current or Ex-Partner: No    Emotionally Abused: No    Physically Abused: No    Sexually Abused: No    Past Surgical History:  Procedure Laterality Date   SIGMOIDECTOMY      Family History  Problem Relation Age of Onset   Stroke Sister    Stroke Brother     Allergies  Allergen Reactions   Cephalexin Swelling   Sulfa Antibiotics Swelling        Latest Ref Rng & Units 06/14/2022   12:51 PM 09/15/2013    6:07 AM 09/14/2013    6:40 AM  CBC  WBC 4.0 - 10.5 K/uL 7.8  10.8  13.1   Hemoglobin 13.0 - 17.0 g/dL 16.1  09.6  04.5   Hematocrit 39.0 - 52.0 % 51.1  43.8  43.9   Platelets 150 - 400 K/uL 193  156  147        CMP     Component Value Date/Time   NA 137 06/14/2022 1251   NA 138 09/15/2013 0607   K 3.8 06/14/2022 1251   K 3.8 09/15/2013 0607   CL 103 06/14/2022 1251   CL 109 (H) 09/15/2013 0607   CO2 26 06/14/2022 1251   CO2 23 09/15/2013 0607   GLUCOSE 105 (H) 06/14/2022 1251   GLUCOSE 81 09/15/2013 0607   BUN 19 06/14/2022 1251   BUN 13 09/15/2013 0607   CREATININE 1.33 (H) 06/14/2022 1251   CREATININE 1.26 09/15/2013 0607   CALCIUM  9.1 06/14/2022 1251   CALCIUM  8.3 (L) 09/15/2013 0607   PROT 7.8 06/14/2022 1251   PROT 7.2 09/15/2013 0607   ALBUMIN 4.5 06/14/2022 1251   ALBUMIN 2.9 (L) 09/15/2013 0607   AST 36 06/14/2022 1251   AST 22 09/15/2013 0607   ALT 34 06/14/2022 1251   ALT 13 09/15/2013 0607   ALKPHOS 58 06/14/2022 1251   ALKPHOS 43 (L) 09/15/2013 0607   BILITOT 0.8 06/14/2022 1251   BILITOT 0.7 09/15/2013 0607   GFRNONAA 53 (L) 06/14/2022 1251   GFRNONAA 56 (L) 09/15/2013 0607     No results found.     Assessment & Plan:   1. Bilateral carotid artery stenosis (Primary) The patient is seen for follow up evaluation of carotid stenosis. The carotid stenosis followed by ultrasound.   The patient denies amaurosis fugax. There is no recent history of TIA symptoms or focal motor deficits. There is no prior documented CVA.  The patient is taking enteric-coated aspirin  81 mg daily.  There is no history of migraine headaches. There is no history of seizures.  No recent shortening of the patient's walking distance or new symptoms consistent with claudication.  No history of rest pain symptoms. No new ulcers or wounds of the lower extremities have occurred.  There is  no history of DVT, PE or  superficial thrombophlebitis. No documented recent episodes of angina or shortness of breath documented.   Carotid Duplex done today shows 30-49% stenosis bilaterally.  No change compared to last study on 09/23/22  2. Infrarenal abdominal aortic aneurysm (AAA) without rupture (HCC) The patient's current aortic duplex revealed an aneurysm measuring 3.3cm AP x 3.3cm transverse (previous 2.8cm).  This exam is one year from previous studies.  The abdominal aortic aneurysm is asymptomatic and less than 4 cm in maximal diameter.  I have reviewed this study with patient, as well as the pathophysiology and risk factors an abdominal aortic aneurysm, as well as the small risk of rupture for aneurysms less than 5 cm in size.  I also explained the need for continued surveillance via ultrasound or CT scan is mandatory, as small aneurysms can enlarge over time.  Currently the patient's blood pressure is adequately controlled, and I have reviewed the importance of hypertension and lipid control.  I have stressed the importance of abstinence from tobacco.  The patient is encouraged to exercise a minimum of 30 minutes at least 5 days a week.    The patient understands that immediate medical attention should be sought if new abdominal pain or back pain develops.  Signs and symptoms of peripheral embolization were discussed, such as sudden pain, pallor, loss of pulse or paresthesia, and these things should also be treated with urgency.  Notifying medical personnel of their AAA is also extremely important.  The patient voices their understanding of all topics discussed.  We will follow up annually with aortic duplex.    3. Essential hypertension Continue antihypertensive medications as already ordered, these medications have been reviewed and there are no changes at this time.  4. Hypothyroidism, unspecified type Continue hypothyroid medications as already ordered, these medications have been reviewed and there are no  changes at this time.   Current Outpatient Medications on File Prior to Visit  Medication Sig Dispense Refill   amLODipine  (NORVASC ) 10 MG tablet Take 1 tablet (10 mg total) by mouth daily. 30 tablet 1   aspirin  EC 81 MG tablet Take 1 tablet (81 mg total) by mouth daily. Swallow whole. 30 tablet 12   ergocalciferol (VITAMIN D2) 1.25 MG (50000 UT) capsule      levothyroxine  (SYNTHROID ) 50 MCG tablet TAKE 1 TABLET EVERY DAY ON EMPTY STOMACHWITH A GLASS OF WATER AT LEAST 30-60 MINBEFORE BREAKFAST     lisinopril  (ZESTRIL ) 10 MG tablet Take 1 tablet (10 mg total) by mouth daily. 30 tablet 0   atorvastatin  (LIPITOR) 40 MG tablet Take 1 tablet (40 mg total) by mouth daily at 10 pm. (Patient not taking: Reported on 09/26/2023) 30 tablet 0   clopidogrel  (PLAVIX ) 75 MG tablet Take 1 tablet (75 mg total) by mouth daily. (Patient not taking: Reported on 09/26/2023) 21 tablet 0   No current facility-administered medications on file prior to visit.    There are no Patient Instructions on file for this visit. No follow-ups on file.   Annamaria Barrette, NP

## 2023-12-28 DIAGNOSIS — E1122 Type 2 diabetes mellitus with diabetic chronic kidney disease: Secondary | ICD-10-CM | POA: Diagnosis not present

## 2023-12-28 DIAGNOSIS — N1832 Chronic kidney disease, stage 3b: Secondary | ICD-10-CM | POA: Diagnosis not present

## 2023-12-28 DIAGNOSIS — E034 Atrophy of thyroid (acquired): Secondary | ICD-10-CM | POA: Diagnosis not present

## 2024-01-04 DIAGNOSIS — E034 Atrophy of thyroid (acquired): Secondary | ICD-10-CM | POA: Diagnosis not present

## 2024-01-04 DIAGNOSIS — N1832 Chronic kidney disease, stage 3b: Secondary | ICD-10-CM | POA: Diagnosis not present

## 2024-01-04 DIAGNOSIS — I10 Essential (primary) hypertension: Secondary | ICD-10-CM | POA: Diagnosis not present

## 2024-01-04 DIAGNOSIS — E782 Mixed hyperlipidemia: Secondary | ICD-10-CM | POA: Diagnosis not present

## 2024-01-04 DIAGNOSIS — Z1331 Encounter for screening for depression: Secondary | ICD-10-CM | POA: Diagnosis not present

## 2024-01-04 DIAGNOSIS — Z0001 Encounter for general adult medical examination with abnormal findings: Secondary | ICD-10-CM | POA: Diagnosis not present

## 2024-01-04 DIAGNOSIS — Z0000 Encounter for general adult medical examination without abnormal findings: Secondary | ICD-10-CM | POA: Diagnosis not present

## 2024-01-04 DIAGNOSIS — E1122 Type 2 diabetes mellitus with diabetic chronic kidney disease: Secondary | ICD-10-CM | POA: Diagnosis not present

## 2024-01-04 DIAGNOSIS — G4733 Obstructive sleep apnea (adult) (pediatric): Secondary | ICD-10-CM | POA: Diagnosis not present

## 2024-04-04 ENCOUNTER — Ambulatory Visit: Admitting: Dermatology

## 2024-04-04 ENCOUNTER — Encounter: Payer: Self-pay | Admitting: Dermatology

## 2024-04-04 DIAGNOSIS — L570 Actinic keratosis: Secondary | ICD-10-CM

## 2024-04-04 DIAGNOSIS — D492 Neoplasm of unspecified behavior of bone, soft tissue, and skin: Secondary | ICD-10-CM | POA: Diagnosis not present

## 2024-04-04 DIAGNOSIS — L578 Other skin changes due to chronic exposure to nonionizing radiation: Secondary | ICD-10-CM | POA: Diagnosis not present

## 2024-04-04 DIAGNOSIS — D044 Carcinoma in situ of skin of scalp and neck: Secondary | ICD-10-CM

## 2024-04-04 DIAGNOSIS — C4492 Squamous cell carcinoma of skin, unspecified: Secondary | ICD-10-CM

## 2024-04-04 DIAGNOSIS — W908XXA Exposure to other nonionizing radiation, initial encounter: Secondary | ICD-10-CM | POA: Diagnosis not present

## 2024-04-04 HISTORY — DX: Actinic keratosis: L57.0

## 2024-04-04 HISTORY — DX: Squamous cell carcinoma of skin, unspecified: C44.92

## 2024-04-04 NOTE — Progress Notes (Signed)
° °  New Patient Visit   Subjective  [name] is a 83 y.o. male who presents for the following: Spots on scalp.   The patient has spots, moles and lesions to be evaluated, some may be new or changing and the patient may have concern these could be cancer.  This patient is accompanied in the office by his wife.    The following portions of the chart were reviewed this encounter and updated as appropriate: medications, allergies, medical history  Review of Systems:  No other skin or systemic complaints except as noted in HPI or Assessment and Plan.  Objective  Well appearing patient in no apparent distress; mood and affect are within normal limits.  A focused examination was performed of the following areas: Scalp, face  Relevant physical exam findings are noted in the Assessment and Plan.  Vertex Scalp 2 x 1 cm pink hyperkeratotic plaque  Right Frontal Scalp 5 mm hyperkeratotic papule    Assessment & Plan   ACTINIC DAMAGE - chronic, secondary to cumulative UV radiation exposure/sun exposure over time - diffuse scaly erythematous macules with underlying dyspigmentation - Recommend daily broad spectrum sunscreen SPF 30+ to sun-exposed areas, reapply every 2 hours as needed.  - Recommend staying in the shade or wearing Hinkle sleeves, sun glasses (UVA+UVB protection) and wide brim hats (4-inch brim around the entire circumference of the hat). - Call for new or changing lesions.  NEOPLASM OF SKIN (2) Vertex Scalp - Skin / nail biopsy Type of biopsy: tangential   Informed consent: discussed and consent obtained   Timeout: patient name, date of birth, surgical site, and procedure verified   Procedure prep:  Patient was prepped and draped in usual sterile fashion Prep type:  Isopropyl alcohol  Anesthesia: the lesion was anesthetized in a standard fashion   Anesthetic:  1% lidocaine w/ epinephrine 1-100,000 buffered w/ 8.4% NaHCO3 Instrument used: DermaBlade   Hemostasis  achieved with: pressure and aluminum chloride   Outcome: patient tolerated procedure well   Post-procedure details: sterile dressing applied and wound care instructions given   Dressing type: bandage and petrolatum    Specimen 1 - Surgical pathology Differential Diagnosis: R/O SCC  Check Margins: No Right Frontal Scalp - Skin / nail biopsy Type of biopsy: tangential   Informed consent: discussed and consent obtained   Timeout: patient name, date of birth, surgical site, and procedure verified   Procedure prep:  Patient was prepped and draped in usual sterile fashion Prep type:  Isopropyl alcohol  Anesthesia: the lesion was anesthetized in a standard fashion   Anesthetic:  1% lidocaine w/ epinephrine 1-100,000 buffered w/ 8.4% NaHCO3 Instrument used: DermaBlade   Hemostasis achieved with: pressure and aluminum chloride   Outcome: patient tolerated procedure well   Post-procedure details: sterile dressing applied and wound care instructions given   Dressing type: bandage and petrolatum    Specimen 2 - Surgical pathology Differential Diagnosis: R/O SCC  Check Margins: No ACTINIC ELASTOSIS     Return for follow up pending patholgy results. SABRA LILLETTE Kate Robin, CMA, am acting as scribe for Boneta Sharps, MD.   Documentation: I have reviewed the above documentation for accuracy and completeness, and I agree with the above.  Boneta Sharps, MD

## 2024-04-04 NOTE — Patient Instructions (Signed)

## 2024-04-08 ENCOUNTER — Ambulatory Visit: Payer: Self-pay | Admitting: Dermatology

## 2024-04-08 ENCOUNTER — Encounter: Payer: Self-pay | Admitting: Dermatology

## 2024-04-08 LAB — SURGICAL PATHOLOGY

## 2024-04-08 NOTE — Telephone Encounter (Signed)
-----   Message from Boneta Sharps, MD sent at 04/08/2024  5:04 PM EST ----- Diagnosis 1. Skin, vertex scalp :       ACTINIC KERATOSIS        2. 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   Vertex scalp: biopsy shows precancerous actinic keratosis R frontal scalp: biopsy shows squamous cell skin cancer limited to the top layer of skin. This means it is an early cancer and has not spread. However, it has the potential to spread beyond the skin  and threaten your health, so we recommend treating it.   FOR BOTH Treatment option 1: a cream (fluorouracil and calcipotriene) that helps your immune system clear the skin cancer. It will cause redness and irritation. Wait two weeks after the biopsy to start  applying the cream. Apply the cream twice per day until the redness and irritation develop (usually occurs by day 7), then stop and allow it to heal. We will recheck the area in 2 months to ensure  the cancer is gone. The cream is $45 plus shipping and will be mailed to you from a low cost compounding pharmacy.  Treatment option 2: you return for a brief appointment where I perform electrodesiccation and curettage [facility]). This involves three rounds of scraping and burning to destroy the skin cancer. It has  about an 85% cure rate and leaves a round wound slightly larger than the skin cancer and leaves a round white scar. No additional pathology is done. If the skin cancer comes back, we would need to do  a surgery to remove it.

## 2024-04-08 NOTE — Telephone Encounter (Signed)
 Discussed pathology results with patient and wife they prefer [facility], appointment scheduled.

## 2024-05-02 ENCOUNTER — Encounter: Payer: Self-pay | Admitting: Dermatology

## 2024-05-02 ENCOUNTER — Ambulatory Visit: Admitting: Dermatology

## 2024-05-02 DIAGNOSIS — D099 Carcinoma in situ, unspecified: Secondary | ICD-10-CM

## 2024-05-02 DIAGNOSIS — W908XXA Exposure to other nonionizing radiation, initial encounter: Secondary | ICD-10-CM

## 2024-05-02 DIAGNOSIS — D044 Carcinoma in situ of skin of scalp and neck: Secondary | ICD-10-CM

## 2024-05-02 DIAGNOSIS — L570 Actinic keratosis: Secondary | ICD-10-CM

## 2024-05-02 NOTE — Progress Notes (Signed)
" ° °  Follow-Up Visit   Subjective  [name] is a 84 y.o. 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  Objective  Well appearing patient in no apparent distress; mood and affect are within normal limits.   A focused examination was performed of the following areas: scalp  Relevant exam findings are noted in the Assessment and Plan.  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 - Destruction of lesion Complexity: extensive   Destruction method: electrodesiccation and curettage   Informed consent: discussed and consent obtained   Timeout:  patient name, date of birth, surgical site, and procedure verified Curettage performed in three different directions: Yes   Electrodesiccation performed over the curetted area: Yes   Final wound size (cm):  1 Hemostasis achieved with:  pressure, aluminum chloride and electrodesiccation Outcome: patient tolerated procedure well with no complications   Post-procedure details: wound care instructions given    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 They are chronic with expected duration over 1 year. A portion of actinic keratoses will progress to squamous cell carcinoma of the skin. It is not possible to reliably predict which spots will progress to skin cancer and so treatment is recommended to prevent development of skin cancer.  Recommend daily broad  spectrum sunscreen SPF 30+ to sun-exposed areas, reapply every 2 hours as needed.  Recommend staying in the shade or wearing Shea sleeves, sun glasses (UVA+UVB protection) and wide brim hats (4-inch brim around the entire circumference of the hat). Call for new or changing lesions. - Destruction of lesion - vertex scalp Complexity: simple   Destruction method: electrodesiccation and curettage   Informed consent: discussed and consent obtained   Timeout:  patient name, date of birth, surgical site, and procedure verified Procedure prep:  Patient was prepped and draped in usual sterile fashion Prep type:  Isopropyl alcohol  Anesthesia: the lesion was anesthetized in a standard fashion   Anesthetic:  1% lidocaine w/ epinephrine 1-100,000 local infiltration Curettage performed in three different directions: Yes   Electrodesiccation performed over the curetted area: Yes   Curettage cycles:  3 Final wound size (cm):  1.7 Hemostasis achieved with:  aluminum chloride and electrodesiccation Outcome: patient tolerated procedure well with no complications   Post-procedure details: sterile dressing applied and wound care instructions given   Dressing type: bandage and petrolatum     Return for 09/26/2024 at 945 for TBSE, Hx of SCC IS, HX of AKs.  I, Grayce Saunas, RMA, am acting as scribe for Boneta Sharps, MD .   Documentation: I have reviewed the above documentation for accuracy and completeness, and I agree with the above.  Boneta Sharps, MD    "

## 2024-05-02 NOTE — Patient Instructions (Signed)

## 2024-09-24 ENCOUNTER — Encounter (INDEPENDENT_AMBULATORY_CARE_PROVIDER_SITE_OTHER)

## 2024-09-24 ENCOUNTER — Other Ambulatory Visit (INDEPENDENT_AMBULATORY_CARE_PROVIDER_SITE_OTHER)

## 2024-09-24 ENCOUNTER — Ambulatory Visit (INDEPENDENT_AMBULATORY_CARE_PROVIDER_SITE_OTHER): Admitting: Vascular Surgery

## 2024-09-26 ENCOUNTER — Ambulatory Visit: Admitting: Dermatology
